# Patient Record
Sex: Female | Born: 1975 | Race: Black or African American | Hispanic: No | Marital: Single | Smoking: Never smoker
Health system: Southern US, Community
[De-identification: ages and names within clinical notes are randomized; demographics above are authoritative.]

## PROBLEM LIST (undated history)

## (undated) DIAGNOSIS — IMO0002 Reserved for concepts with insufficient information to code with codable children: Secondary | ICD-10-CM

## (undated) DIAGNOSIS — D649 Anemia, unspecified: Secondary | ICD-10-CM

## (undated) DIAGNOSIS — I10 Essential (primary) hypertension: Secondary | ICD-10-CM

## (undated) HISTORY — PX: LEEP: SHX91

---

## 1998-02-26 ENCOUNTER — Other Ambulatory Visit: Admission: RE | Admit: 1998-02-26 | Discharge: 1998-02-26 | Payer: Self-pay | Admitting: Gynecology

## 1999-04-12 ENCOUNTER — Emergency Department (HOSPITAL_COMMUNITY): Admission: EM | Admit: 1999-04-12 | Discharge: 1999-04-12 | Payer: Self-pay | Admitting: Emergency Medicine

## 1999-04-12 ENCOUNTER — Encounter: Payer: Self-pay | Admitting: Emergency Medicine

## 2000-02-20 ENCOUNTER — Emergency Department (HOSPITAL_COMMUNITY): Admission: EM | Admit: 2000-02-20 | Discharge: 2000-02-20 | Payer: Self-pay | Admitting: Emergency Medicine

## 2000-02-20 ENCOUNTER — Encounter: Payer: Self-pay | Admitting: Emergency Medicine

## 2000-02-26 ENCOUNTER — Other Ambulatory Visit: Admission: RE | Admit: 2000-02-26 | Discharge: 2000-02-26 | Payer: Self-pay | Admitting: Gynecology

## 2000-02-26 ENCOUNTER — Encounter (INDEPENDENT_AMBULATORY_CARE_PROVIDER_SITE_OTHER): Payer: Self-pay

## 2000-09-28 ENCOUNTER — Emergency Department (HOSPITAL_COMMUNITY): Admission: EM | Admit: 2000-09-28 | Discharge: 2000-09-28 | Payer: Self-pay | Admitting: Emergency Medicine

## 2000-10-14 ENCOUNTER — Encounter: Payer: Self-pay | Admitting: Orthopedic Surgery

## 2000-10-14 ENCOUNTER — Encounter: Admission: RE | Admit: 2000-10-14 | Discharge: 2000-10-14 | Payer: Self-pay | Admitting: Orthopedic Surgery

## 2000-10-26 ENCOUNTER — Other Ambulatory Visit: Admission: RE | Admit: 2000-10-26 | Discharge: 2000-10-26 | Payer: Self-pay | Admitting: Internal Medicine

## 2000-11-16 ENCOUNTER — Inpatient Hospital Stay (HOSPITAL_COMMUNITY): Admission: AD | Admit: 2000-11-16 | Discharge: 2000-11-16 | Payer: Self-pay | Admitting: Obstetrics

## 2000-11-16 ENCOUNTER — Encounter: Payer: Self-pay | Admitting: Obstetrics

## 2000-12-21 ENCOUNTER — Other Ambulatory Visit: Admission: RE | Admit: 2000-12-21 | Discharge: 2000-12-21 | Payer: Self-pay | Admitting: Obstetrics and Gynecology

## 2001-05-01 ENCOUNTER — Encounter: Payer: Self-pay | Admitting: Internal Medicine

## 2001-05-01 ENCOUNTER — Ambulatory Visit (HOSPITAL_COMMUNITY): Admission: RE | Admit: 2001-05-01 | Discharge: 2001-05-01 | Payer: Self-pay | Admitting: Internal Medicine

## 2002-06-15 ENCOUNTER — Inpatient Hospital Stay (HOSPITAL_COMMUNITY): Admission: AD | Admit: 2002-06-15 | Discharge: 2002-06-15 | Payer: Self-pay | Admitting: Obstetrics and Gynecology

## 2002-10-17 ENCOUNTER — Ambulatory Visit (HOSPITAL_COMMUNITY): Admission: RE | Admit: 2002-10-17 | Discharge: 2002-10-17 | Payer: Self-pay | Admitting: Obstetrics

## 2002-10-17 ENCOUNTER — Encounter: Payer: Self-pay | Admitting: Obstetrics

## 2002-10-26 ENCOUNTER — Inpatient Hospital Stay (HOSPITAL_COMMUNITY): Admission: AD | Admit: 2002-10-26 | Discharge: 2002-10-26 | Payer: Self-pay | Admitting: Obstetrics

## 2002-11-06 ENCOUNTER — Ambulatory Visit (HOSPITAL_COMMUNITY): Admission: RE | Admit: 2002-11-06 | Discharge: 2002-11-06 | Payer: Self-pay | Admitting: Obstetrics

## 2002-12-19 ENCOUNTER — Inpatient Hospital Stay (HOSPITAL_COMMUNITY): Admission: AD | Admit: 2002-12-19 | Discharge: 2002-12-19 | Payer: Self-pay | Admitting: Obstetrics

## 2002-12-28 ENCOUNTER — Inpatient Hospital Stay (HOSPITAL_COMMUNITY): Admission: AD | Admit: 2002-12-28 | Discharge: 2002-12-29 | Payer: Self-pay | Admitting: Obstetrics

## 2003-01-07 ENCOUNTER — Inpatient Hospital Stay (HOSPITAL_COMMUNITY): Admission: AD | Admit: 2003-01-07 | Discharge: 2003-01-07 | Payer: Self-pay | Admitting: Obstetrics

## 2003-01-13 ENCOUNTER — Inpatient Hospital Stay (HOSPITAL_COMMUNITY): Admission: AD | Admit: 2003-01-13 | Discharge: 2003-01-13 | Payer: Self-pay | Admitting: Obstetrics

## 2003-01-28 ENCOUNTER — Inpatient Hospital Stay (HOSPITAL_COMMUNITY): Admission: AD | Admit: 2003-01-28 | Discharge: 2003-01-28 | Payer: Self-pay | Admitting: Obstetrics

## 2003-02-01 ENCOUNTER — Encounter (INDEPENDENT_AMBULATORY_CARE_PROVIDER_SITE_OTHER): Payer: Self-pay | Admitting: *Deleted

## 2003-02-01 ENCOUNTER — Inpatient Hospital Stay (HOSPITAL_COMMUNITY): Admission: AD | Admit: 2003-02-01 | Discharge: 2003-02-04 | Payer: Self-pay | Admitting: Obstetrics

## 2003-02-06 ENCOUNTER — Encounter: Admission: RE | Admit: 2003-02-06 | Discharge: 2003-03-08 | Payer: Self-pay | Admitting: Obstetrics

## 2003-03-09 ENCOUNTER — Encounter: Admission: RE | Admit: 2003-03-09 | Discharge: 2003-04-08 | Payer: Self-pay | Admitting: Obstetrics

## 2003-05-07 ENCOUNTER — Encounter: Admission: RE | Admit: 2003-05-07 | Discharge: 2003-06-06 | Payer: Self-pay | Admitting: Obstetrics

## 2003-07-07 ENCOUNTER — Encounter: Admission: RE | Admit: 2003-07-07 | Discharge: 2003-08-06 | Payer: Self-pay | Admitting: Obstetrics

## 2003-09-06 ENCOUNTER — Encounter: Admission: RE | Admit: 2003-09-06 | Discharge: 2003-10-06 | Payer: Self-pay | Admitting: Obstetrics

## 2003-10-07 ENCOUNTER — Encounter: Admission: RE | Admit: 2003-10-07 | Discharge: 2003-11-06 | Payer: Self-pay | Admitting: Obstetrics

## 2003-12-07 ENCOUNTER — Encounter: Admission: RE | Admit: 2003-12-07 | Discharge: 2004-01-06 | Payer: Self-pay | Admitting: Obstetrics

## 2004-02-06 ENCOUNTER — Encounter: Admission: RE | Admit: 2004-02-06 | Discharge: 2004-03-07 | Payer: Self-pay | Admitting: Obstetrics

## 2004-02-06 ENCOUNTER — Ambulatory Visit: Payer: Self-pay | Admitting: Internal Medicine

## 2004-03-08 ENCOUNTER — Encounter: Admission: RE | Admit: 2004-03-08 | Discharge: 2004-04-07 | Payer: Self-pay | Admitting: Obstetrics

## 2004-05-11 ENCOUNTER — Ambulatory Visit: Payer: Self-pay | Admitting: Internal Medicine

## 2004-06-25 ENCOUNTER — Ambulatory Visit: Payer: Self-pay | Admitting: Internal Medicine

## 2004-10-07 ENCOUNTER — Ambulatory Visit: Payer: Self-pay | Admitting: Internal Medicine

## 2004-12-01 ENCOUNTER — Ambulatory Visit: Payer: Self-pay | Admitting: Internal Medicine

## 2004-12-08 ENCOUNTER — Ambulatory Visit: Payer: Self-pay | Admitting: Internal Medicine

## 2005-01-07 ENCOUNTER — Ambulatory Visit: Payer: Self-pay | Admitting: Endocrinology

## 2005-04-15 ENCOUNTER — Ambulatory Visit: Payer: Self-pay | Admitting: Internal Medicine

## 2006-01-19 ENCOUNTER — Ambulatory Visit: Payer: Self-pay | Admitting: Internal Medicine

## 2006-04-13 ENCOUNTER — Ambulatory Visit: Payer: Self-pay | Admitting: Internal Medicine

## 2006-05-30 ENCOUNTER — Ambulatory Visit: Payer: Self-pay | Admitting: Internal Medicine

## 2007-02-20 ENCOUNTER — Telehealth: Payer: Self-pay | Admitting: Internal Medicine

## 2007-06-19 ENCOUNTER — Encounter: Payer: Self-pay | Admitting: *Deleted

## 2007-06-19 DIAGNOSIS — J45909 Unspecified asthma, uncomplicated: Secondary | ICD-10-CM | POA: Insufficient documentation

## 2007-06-19 DIAGNOSIS — D509 Iron deficiency anemia, unspecified: Secondary | ICD-10-CM | POA: Insufficient documentation

## 2007-06-19 DIAGNOSIS — F410 Panic disorder [episodic paroxysmal anxiety] without agoraphobia: Secondary | ICD-10-CM | POA: Insufficient documentation

## 2007-06-19 DIAGNOSIS — G4700 Insomnia, unspecified: Secondary | ICD-10-CM | POA: Insufficient documentation

## 2007-06-19 DIAGNOSIS — Z8659 Personal history of other mental and behavioral disorders: Secondary | ICD-10-CM

## 2007-06-19 DIAGNOSIS — G43909 Migraine, unspecified, not intractable, without status migrainosus: Secondary | ICD-10-CM | POA: Insufficient documentation

## 2007-06-19 DIAGNOSIS — D573 Sickle-cell trait: Secondary | ICD-10-CM

## 2008-09-05 ENCOUNTER — Emergency Department (HOSPITAL_COMMUNITY): Admission: EM | Admit: 2008-09-05 | Discharge: 2008-09-05 | Payer: Self-pay | Admitting: Emergency Medicine

## 2008-09-06 ENCOUNTER — Emergency Department (HOSPITAL_COMMUNITY): Admission: EM | Admit: 2008-09-06 | Discharge: 2008-09-06 | Payer: Self-pay | Admitting: Emergency Medicine

## 2008-09-20 ENCOUNTER — Emergency Department (HOSPITAL_COMMUNITY): Admission: EM | Admit: 2008-09-20 | Discharge: 2008-09-20 | Payer: Self-pay | Admitting: Family Medicine

## 2008-10-28 ENCOUNTER — Encounter: Admission: RE | Admit: 2008-10-28 | Discharge: 2008-10-28 | Payer: Self-pay | Admitting: Family Medicine

## 2009-07-08 ENCOUNTER — Encounter: Admission: RE | Admit: 2009-07-08 | Discharge: 2009-07-08 | Payer: Self-pay | Admitting: Family Medicine

## 2010-01-03 ENCOUNTER — Emergency Department (HOSPITAL_COMMUNITY)
Admission: EM | Admit: 2010-01-03 | Discharge: 2010-01-03 | Payer: Self-pay | Source: Home / Self Care | Admitting: Emergency Medicine

## 2010-01-31 ENCOUNTER — Encounter: Payer: Self-pay | Admitting: Obstetrics

## 2010-02-01 ENCOUNTER — Encounter: Payer: Self-pay | Admitting: Obstetrics & Gynecology

## 2010-02-04 ENCOUNTER — Ambulatory Visit: Payer: Self-pay | Admitting: Genetic Counselor

## 2010-02-11 ENCOUNTER — Encounter: Payer: Self-pay | Admitting: Obstetrics & Gynecology

## 2010-03-23 LAB — POCT URINALYSIS DIPSTICK
Bilirubin Urine: NEGATIVE
Glucose, UA: 250 mg/dL — AB
Hgb urine dipstick: NEGATIVE
Nitrite: POSITIVE — AB
Protein, ur: 30 mg/dL — AB
Specific Gravity, Urine: 1.02 (ref 1.005–1.030)
Urobilinogen, UA: 1 mg/dL (ref 0.0–1.0)
pH: 5 (ref 5.0–8.0)

## 2010-03-23 LAB — POCT PREGNANCY, URINE: Preg Test, Ur: NEGATIVE

## 2010-03-23 LAB — URINE CULTURE
Colony Count: NO GROWTH
Culture  Setup Time: 201112242052
Culture: NO GROWTH

## 2010-05-29 NOTE — H&P (Signed)
Crystal Edwards, Crystal Edwards                     ACCOUNT NO.:  [id number]   MEDICAL RECORD NO.:  [id number]                   PATIENT TYPE:  INP   LOCATION:  9101                                 FACILITY:  [facility]   PHYSICIAN:  Kathreen Cosier, M.D.           DATE OF BIRTH:  1975-09-01   DATE OF ADMISSION:  02/01/2003  DATE OF DISCHARGE:                                HISTORY & PHYSICAL   HISTORY OF PRESENT ILLNESS:  The patient is a 35 year old gravida 3 para 0-0-  2-0, EDC January 30, 2003 who was brought in for induction at term.  Estimated fetal weight 3600 g by ultrasound.  Positive GBS treated with  ampicillin at 37 weeks.  Cervix was 2-3 cm, 70%, vertex, -2.  Membranes were  artificially ruptured and the fluid was clear.  At 3:45 p.m. she was 2-3 cm,  90%, vertex 0 station, and IUPC was inserted and she was on 2 mU of Pitocin.  Shortly after the IUPC the patient started having heavy bleeding and deep  variables with each contraction and the cervix was 3 cm, and it was decided  she would be delivered by C-section because of nonreassuring fetal heart  rate tracing and possibly abruption of placenta.   PHYSICAL EXAMINATION:  GENERAL:  Revealed a well-developed female in labor.  HEENT:  Negative.  LUNGS:  Clear.  HEART:  Regular rhythm, no murmurs, no gallops.  ABDOMEN:  Term.  PELVIC:  As described above.  EXTREMITIES:  Negative.                                               Kathreen Cosier, M.D.    BAM/MEDQ  D:  02/04/2003  T:  02/04/2003  Job:  [id number]

## 2010-05-29 NOTE — Assessment & Plan Note (Signed)
[facility]                             PULMONARY OFFICE NOTE   NAME:Edwards, Crystal HIDROGO                  MRN:          [id number]  DATE:04/13/2006                            DOB:          March 17, 1975    REASON FOR CONSULTATION:  Asthma.   HISTORY:  A 35 year old black female who remembers having difficulty  breathing in hot, humid weather when she ran track in college, but  until 2002 had no chronic respiratory complaints.  Since then she has  been bothered by a cough that is present off and on, day more than  night, occasionally productive of yellow sputum but not typically  exacerbated in the morning.  She has been diagnosed with asthma and  started on Advair which she said helped some.  She uses albuterol on  the average of only 1-2 time weekly and is not consistently aerobically  active but finds that the only time she really needs albuterol is when  she does exercise.  That is, most of her complaints at this point are  related to cough and not dyspnea.  She denies any itching, sneezing, or  subjective wheezing, fevers, chills, sweats, chest pain, or leg  swelling.   PAST MEDICAL HISTORY:  Significant for the absence of atopy or sinus  disease.  Note that she had normal childbirth 3 years ago without a  definite flare up.  No other major illnesses or diagnoses or surgery.   ALLERGIES:  NONE KNOWN.   MEDICATIONS:  1. Advair 250/50 b.i.d.  2. Anorexic agent she is using b.i.d. as well.   SOCIAL HISTORY:  She has never smoked, she works as a Financial controller.  She is the mother of a 60-year-old child of an [employer].   FAMILY HISTORY:  Positive for allergies and asthma in her grandmother.   REVIEW OF SYSTEMS:  Taken in detail on the worksheet, negative except as  outlined above.   PHYSICAL EXAMINATION:  GENERAL:  This is a pleasant ambulatory black  female, in no acute distress.  VITAL SIGNS:  Normal.  HEENT:  Reveals mild  turbinate edema, oropharynx clear.  NECK:  Supple without cervical adenopathy, tenderness, trachea is  midline, no thyromegaly.  LUNGS:  Fields perfectly clear bilaterally to auscultation and  percussion.  Regular rhythm without murmur, gallop, rub.  ABDOMEN:  Soft, benign.  EXTREMITIES:  Warm without calf tenderness, cyanosis, clubbing, edema.   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  That suggests a  possible functional component, although the fact that she is coughing as  her main manifestation with slightly yellow sputum production would  indicate two different diagnoses.  One might be atypical asthma and the  other that is chronic cough related to postnasal drip syndrome.   To sort through the differential I reviewed with her a gastroesophageal  reflux disease diet (since almost all patients with chronic cough are  refluxing whether they know it or not), advising her to avoid mint and  menthol lozenges, to use albuterol 2 puffs every  4 hours p.r.n., but at  this point stop Advair and see if the cough exacerbates or albuterol  needs goes up (in which case definitely asthma is the most likely  diagnosis).   To treat the chronic exercise induced symptoms I have recommended trial  of Singulair 10 mg one q.p.m. to see to what extent this improves her,  and if not, a trial of Qvar (versus a methacholine challenge test for  definitive proof of asthma) would be a reasonable next step.   I have asked her to return in 4 weeks for followup to see if Singulair  is helping, and if her cough persists I am going to recommend a sinus  scan be done immediately before next visit to exclude this from the  list of suspects.   I spent extra time with the patient going over the differential  diagnosis today in detail with her, including the need for a  therapeutic withdrawing of Advair at this time (since Advair  actually  may be contributing in part to her cough).     Charlaine Dalton. Sherene Sires, MD, [facility]  Electronically Signed    MBW/MedQ  DD: 04/13/2006  DT: 04/13/2006  Job #: [id number]

## 2010-05-29 NOTE — Discharge Summary (Signed)
NAME:  Crystal Edwards, Crystal Edwards                     ACCOUNT NO.:  [id number]   MEDICAL RECORD NO.:  [id number]                   PATIENT TYPE:  INP   LOCATION:  9101                                 [facility]ILITY[facility]  [facility]   PHYSICIAN:  Kathreen Cosier, M.D.           DATE OF BIRTH:  08-31-1975   DATE OF ADMISSION:  02/01/2003  DATE OF DISCHARGE:  02/04/2003                                 DISCHARGE SUMMARY   HISTORY OF PRESENT ILLNESS:  The patient is a 35 year old gravida 3, para 0-  0-2-0 who was brought in for induction at term and subsequently had an  abruption and underwent a primary low transverse cesarean section.   LABORATORY DATA:  On admission, her hemoglobin was 10.6, platelets 267,000.  Postoperative hemoglobin 9.5. Platelets 213,000.   HOSPITAL COURSE:  Postoperatively, she did well and was discharged on the  third postoperative day, ambulatory, on a regular diet, on Tylox for pain.   FOLLOW UP:  To see me in 6 weeks.   DISCHARGE DIAGNOSES:  Status post primary low transverse cesarean section at  term for abruption of placenta.                                               Kathreen Cosier, M.D.    BAM/MEDQ  D:  02/04/2003  T:  02/04/2003  Job:  [id number]

## 2010-05-29 NOTE — Op Note (Signed)
Crystal Edwards, Crystal Edwards                     ACCOUNT NO.:  [id number]   MEDICAL RECORD NO.:  [id number]                   PATIENT TYPE:  INP   LOCATION:  9101                                 [facility]   PHYSICIAN:  Kathreen Cosier, M.D.           DATE OF BIRTH:  1976/01/10   DATE OF PROCEDURE:  02/01/2003  DATE OF DISCHARGE:                                 OPERATIVE REPORT   PREOPERATIVE DIAGNOSES:  1. Nonreassuring fetal heart rate tracing.  2. Possibly abruption.   POSTOPERATIVE DIAGNOSIS:  Abruption of the placenta.   SURGEON:  Kathreen Cosier, M.D.   ANESTHESIA:  Epidural.   DESCRIPTION OF PROCEDURE:  Patient on the operating table in a supine  position.  Abdomen prepped and draped, bladder emptied with a Foley  catheter.  A transverse suprapubic incision made and carried down to the  rectus fascia.  The fascia cleaned and incised the length of the incision.  The recti muscles retracted laterally, peritoneum incised longitudinally.  A  transverse incision made in the visceral peritoneum above the bladder and  the bladder mobilized inferiorly.  A transverse lower uterine incision made.  In the uterus was a large amount of blood, which exited the uterus.  She had  a female, Apgar 8 and 9, with terminal meconium.  The pH was 7.22.  The baby  weighed 8 pounds 9 ounces and was a female.  The placenta had a small  abruption.  The placenta was anterior and removed manually.  The uterine  cavity cleaned with dry laps.  The uterine incision closed in one layer with  continuous suture of #1 chromic.  The hemostasis was satisfactory, bladder  flap reattached with 2-0 chromic.  The uterus well-contracted, tubes and  ovaries normal.  Abdomen closed in layers, peritoneum with continuous suture  of 0 chromic, fascia with continuous suture of 0 Dexon.  Skin closed with  subcuticular stitch of 4-0 Monocryl.  Blood loss 500 mL.     Kathreen Cosier, M.D.    BAM/MEDQ  D:  02/04/2003  T:  02/04/2003  Job:  [id number]

## 2010-08-01 ENCOUNTER — Emergency Department (HOSPITAL_COMMUNITY)
Admission: EM | Admit: 2010-08-01 | Discharge: 2010-08-01 | Disposition: A | Discharge status: Home / Self Care | Payer: Self-pay | Attending: Emergency Medicine | Admitting: Emergency Medicine

## 2010-08-01 DIAGNOSIS — J020 Streptococcal pharyngitis: Secondary | ICD-10-CM | POA: Insufficient documentation

## 2010-08-01 DIAGNOSIS — R509 Fever, unspecified: Secondary | ICD-10-CM | POA: Insufficient documentation

## 2010-08-01 DIAGNOSIS — R599 Enlarged lymph nodes, unspecified: Secondary | ICD-10-CM | POA: Insufficient documentation

## 2010-08-01 LAB — RAPID STREP SCREEN (MED CTR MEBANE ONLY): Streptococcus, Group A Screen (Direct): POSITIVE — AB

## 2012-10-10 ENCOUNTER — Emergency Department (HOSPITAL_COMMUNITY)
Admission: EM | Admit: 2012-10-10 | Discharge: 2012-10-11 | Disposition: A | Discharge status: Home / Self Care | Payer: Medicaid Other | Attending: Emergency Medicine | Admitting: Emergency Medicine

## 2012-10-10 ENCOUNTER — Encounter (HOSPITAL_COMMUNITY): Payer: Self-pay | Admitting: Emergency Medicine

## 2012-10-10 DIAGNOSIS — R300 Dysuria: Secondary | ICD-10-CM | POA: Insufficient documentation

## 2012-10-10 DIAGNOSIS — Z3202 Encounter for pregnancy test, result negative: Secondary | ICD-10-CM | POA: Insufficient documentation

## 2012-10-10 DIAGNOSIS — Z8619 Personal history of other infectious and parasitic diseases: Secondary | ICD-10-CM | POA: Insufficient documentation

## 2012-10-10 DIAGNOSIS — D649 Anemia, unspecified: Secondary | ICD-10-CM | POA: Insufficient documentation

## 2012-10-10 DIAGNOSIS — N898 Other specified noninflammatory disorders of vagina: Secondary | ICD-10-CM

## 2012-10-10 DIAGNOSIS — Z79899 Other long term (current) drug therapy: Secondary | ICD-10-CM | POA: Insufficient documentation

## 2012-10-10 HISTORY — DX: Reserved for concepts with insufficient information to code with codable children: IMO0002

## 2012-10-10 HISTORY — DX: Anemia, unspecified: D64.9

## 2012-10-10 LAB — WET PREP, GENITAL
Clue Cells Wet Prep HPF POC: NONE SEEN
Trich, Wet Prep: NONE SEEN
Yeast Wet Prep HPF POC: NONE SEEN

## 2012-10-10 LAB — URINALYSIS, ROUTINE W REFLEX MICROSCOPIC
Bilirubin Urine: NEGATIVE
Glucose, UA: NEGATIVE mg/dL
Ketones, ur: NEGATIVE mg/dL
Leukocytes, UA: NEGATIVE
Nitrite: NEGATIVE
Protein, ur: NEGATIVE mg/dL
Specific Gravity, Urine: 1.031 — ABNORMAL HIGH (ref 1.005–1.030)
Urobilinogen, UA: 0.2 mg/dL (ref 0.0–1.0)
pH: 5 (ref 5.0–8.0)

## 2012-10-10 LAB — URINE MICROSCOPIC-ADD ON

## 2012-10-10 LAB — PREGNANCY, URINE: Preg Test, Ur: NEGATIVE

## 2012-10-10 MED ORDER — CEFIXIME 400 MG PO TABS
400.0000 mg | ORAL_TABLET | Freq: Once | ORAL | Status: AC
  Administered 2012-10-11: 400 mg via ORAL
  Filled 2012-10-10: qty 1

## 2012-10-10 NOTE — ED Notes (Signed)
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

## 2012-10-11 LAB — GC/CHLAMYDIA PROBE AMP
CT Probe RNA: NEGATIVE
GC Probe RNA: NEGATIVE

## 2012-10-11 MED ORDER — DOXYCYCLINE HYCLATE 100 MG PO CAPS: 100.0000 mg | ORAL_CAPSULE | Freq: Two times a day (BID) | ORAL | Status: DC

## 2012-10-11 NOTE — ED Provider Notes (Signed)
  Medical screening examination/treatment/procedure(s) were performed by non-physician practitioner and as supervising physician I was immediately available for consultation/collaboration.    Gerhard Munch, MD 10/11/12 [phone]

## 2012-10-11 NOTE — ED Provider Notes (Signed)
CSN: [id number]     Arrival date & time 10/10/12  1945 History   First MD Initiated Contact with Patient 10/10/12 2157     Chief Complaint  Patient presents with  . Pelvic Pain  . Vaginal Discharge   (Consider location/radiation/quality/duration/timing/severity/associated sxs/prior Treatment) HPI Patient presents emergency department with a one-month history of vaginal pain, and discharged.  Patient, states she's also having discomfort with urination.  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  Patient, states she's not been sexually active for the last 3 years.  Patient denies nausea, vomiting, headache, blurred vision, weakness, numbness, dizziness, fever, chest pain, shortness of breath, or syncope.  Patient, states, that she did not take any medications prior to arrival Past Medical History  Diagnosis Date  . HPV test positive   . Anemia    Past Surgical History  Procedure Laterality Date  . Leep    . Cesarean section     Family History  Problem Relation Age of Onset  . Cancer Mother   . Hypertension Mother   . Cancer Other   . Hypertension Other   . Diabetes Other    History  Substance Use Topics  . Smoking status: Never Smoker   . Smokeless tobacco: Not on file  . Alcohol Use: No   OB History   Grav Para Term Preterm Abortions TAB SAB Ect Mult Living                 Review of Systems All other systems negative except as documented in the HPI. All pertinent positives and negatives as reviewed in the HPI. Allergies  Review of patient's allergies indicates no known allergies.  Home Medications   Current Outpatient Rx  Name  Route  Sig  Dispense  Refill  . ferrous sulfate 325 (65 FE) MG tablet   Oral   Take 325 mg by mouth daily with breakfast.         . HYDROcodone-acetaminophen (VICODIN) 5-500 MG per tablet   Oral   Take 1 tablet by mouth once.         . naproxen sodium (ANAPROX) 220 MG tablet   Oral   Take 220 mg by mouth 2 (two) times daily  with a meal.          BP 161/87  Pulse 76  Temp(Src) 98.3 F (36.8 C) (Oral)  Resp 18  SpO2 100%  LMP 09/24/2012 Physical Exam  Nursing note and vitals reviewed. Constitutional: She is oriented to person, place, and time. She appears well-developed and well-nourished. No distress.  HENT:  Head: Normocephalic and atraumatic.  Eyes: Pupils are equal, round, and reactive to light.  Neck: Normal range of motion. Neck supple.  Cardiovascular: Normal rate, regular rhythm and normal heart sounds.  Exam reveals no gallop and no friction rub.   No murmur heard. Pulmonary/Chest: Effort normal and breath sounds normal. No respiratory distress.  Genitourinary: Pelvic exam was performed with patient supine. Cervix exhibits discharge. Cervix exhibits no motion tenderness and no friability. Right adnexum displays tenderness. Right adnexum displays no mass and no fullness. Left adnexum displays tenderness. Left adnexum displays no mass and no fullness. Vaginal discharge found.  Chaperone present during exam  Neurological: She is alert and oriented to person, place, and time.  Skin: Skin is warm and dry.    ED Course  Procedures (including critical care time) Labs Review Labs Reviewed  WET PREP, GENITAL - Abnormal; Notable for the following:    WBC,  Wet Prep HPF POC FEW (*)    All other components within normal limits  URINALYSIS, ROUTINE W REFLEX MICROSCOPIC - Abnormal; Notable for the following:    Color, Urine AMBER (*)    APPearance CLOUDY (*)    Specific Gravity, Urine 1.031 (*)    Hgb urine dipstick TRACE (*)    All other components within normal limits  URINE MICROSCOPIC-ADD ON - Abnormal; Notable for the following:    Squamous Epithelial / LPF MANY (*)    All other components within normal limits  GC/CHLAMYDIA PROBE AMP  PREGNANCY, URINE   The patient will be treated for vaginal infection based on her HPI and PE. The patient will be referred to [facility]. Told to return here as  needed.  MDM      Carlyle Dolly, PA-C 10/11/12 0013

## 2012-10-16 ENCOUNTER — Ambulatory Visit: Payer: Medicaid Other | Admitting: Obstetrics

## 2012-11-02 ENCOUNTER — Ambulatory Visit: Payer: Self-pay | Admitting: Obstetrics

## 2013-04-26 ENCOUNTER — Other Ambulatory Visit: Payer: Self-pay | Admitting: Obstetrics & Gynecology

## 2013-11-20 ENCOUNTER — Emergency Department (HOSPITAL_COMMUNITY)
Admission: EM | Admit: 2013-11-20 | Discharge: 2013-11-20 | Disposition: A | Discharge status: Home / Self Care | Payer: Medicaid Other | Source: Home / Self Care | Attending: Family Medicine | Admitting: Family Medicine

## 2013-11-20 ENCOUNTER — Encounter (HOSPITAL_COMMUNITY): Payer: Self-pay | Admitting: Emergency Medicine

## 2013-11-20 DIAGNOSIS — I10 Essential (primary) hypertension: Secondary | ICD-10-CM

## 2013-11-20 DIAGNOSIS — R202 Paresthesia of skin: Secondary | ICD-10-CM

## 2013-11-20 LAB — POCT I-STAT, CHEM 8
BUN: 7 mg/dL (ref 6–23)
Calcium, Ion: 1.19 mmol/L (ref 1.12–1.23)
Chloride: 104 mEq/L (ref 96–112)
Creatinine, Ser: 0.7 mg/dL (ref 0.50–1.10)
Glucose, Bld: 109 mg/dL — ABNORMAL HIGH (ref 70–99)
HCT: 31 % — ABNORMAL LOW (ref 36.0–46.0)
Hemoglobin: 10.5 g/dL — ABNORMAL LOW (ref 12.0–15.0)
Potassium: 3.3 mEq/L — ABNORMAL LOW (ref 3.7–5.3)
Sodium: 139 mEq/L (ref 137–147)
TCO2: 24 mmol/L (ref 0–100)

## 2013-11-20 MED ORDER — LISINOPRIL 10 MG PO TABS: 10.0000 mg | ORAL_TABLET | Freq: Every day | ORAL | Status: DC

## 2013-11-20 NOTE — ED Provider Notes (Signed)
Crystal Edwards is a 38 y.o. female who presents to Urgent Care today for Numbness and tingling and hypertension.   Patient notes that her blood pressures been elevated recently. She denies any chest pains or palpitations or shortness of breath. She has never tried blood pressure medicine in the past. She does however note some right arm tingling sensations for the past 2 weeks. She denies any injury or neck pain. No weakness or numbness. No other symptoms.   Past Medical History  Diagnosis Date  . HPV test positive   . Anemia    Past Surgical History  Procedure Laterality Date  . Leep    . Cesarean section     History  Substance Use Topics  . Smoking status: Never Smoker   . Smokeless tobacco: Not on file  . Alcohol Use: No   ROS as above Medications: No current facility-administered medications for this encounter.   Current Outpatient Prescriptions  Medication Sig Dispense Refill  . ferrous sulfate 325 (65 FE) MG tablet Take 325 mg by mouth daily with breakfast.    . lisinopril (PRINIVIL,ZESTRIL) 10 MG tablet Take 1 tablet (10 mg total) by mouth daily. 30 tablet 1   No Known Allergies   Exam:  BP 173/102 mmHg  Pulse 69  Temp(Src) 98.3 F (36.8 C) (Oral)  Resp 16  SpO2 100% Gen: Well NAD HEENT: EOMI,  MMM Lungs: Normal work of breathing. CTABL Heart: RRR no MRG Abd: NABS, Soft. Nondistended, Nontender Exts: Brisk capillary refill, warm and well perfused.  Neuro: normal sensation strength reflexes and coordination bilateral upper extremities Neck: nontender to spinal midline. Normal neck range of motion negative Spurling's test  Results for orders placed or performed during the hospital encounter of 11/20/13 (from the past 24 hour(s))  I-STAT, chem 8     Status: Abnormal   Collection Time: 11/20/13  4:55 PM  Result Value Ref Range   Sodium 139 137 - 147 mEq/L   Potassium 3.3 (L) 3.7 - 5.3 mEq/L   Chloride 104 96 - 112 mEq/L   BUN 7 6 - 23 mg/dL   Creatinine, Ser 0.70 0.50 - 1.10 mg/dL   Glucose, Bld 109 (H) 70 - 99 mg/dL   Calcium, Ion 1.19 1.12 - 1.23 mmol/L   TCO2 24 0 - 100 mmol/L   Hemoglobin 10.5 (L) 12.0 - 15.0 g/dL   HCT 31.0 (L) 36.0 - 46.0 %   No results found.  Assessment and Plan: 38 y.o. female with  1) hypertension: treatment with lisinopril. Advised to use condoms with this medicine.  2) . Paresthesia: unclear etiology. Follow-up with PM&R  Discussed warning signs or symptoms. Please see discharge instructions. Patient expresses understanding.  Elby Showers, MD 11/20/13 [phone]

## 2013-11-20 NOTE — ED Notes (Signed)
Pt is here today because she has had numbness and tingling in her right arm, she has also been hypertensive, pt has taken her BP a few times on her own and it has been elevated, pt is sitting upright and does not appear in any distress

## 2013-11-20 NOTE — Discharge Instructions (Signed)
Thank you for coming in today. Take lisinopril daily for blood pressure control. Be sure to get your labs rechecked in a few weeks. Additionally avoid becoming pregnant while taking this medication. Use birth control if having sex. Follow-up with Dr. Ermalene Postin at [place] for evaluation of your arm.  Call or go to the emergency room if you get worse, have trouble breathing, have chest pains, or palpitations.   Hypertension Hypertension, commonly called high blood pressure, is when the force of blood pumping through your arteries is too strong. Your arteries are the blood vessels that carry blood from your heart throughout your body. A blood pressure reading consists of a higher number over a lower number, such as 110/72. The higher number (systolic) is the pressure inside your arteries when your heart pumps. The lower number (diastolic) is the pressure inside your arteries when your heart relaxes. Ideally you want your blood pressure below 120/80. Hypertension forces your heart to work harder to pump blood. Your arteries may become narrow or stiff. Having hypertension puts you at risk for heart disease, stroke, and other problems.  RISK FACTORS Some risk factors for high blood pressure are controllable. Others are not.  Risk factors you cannot control include:   Race. You may be at higher risk if you are African American.  Age. Risk increases with age.  Gender. Men are at higher risk than women before age 30 years. After age 73, women are at higher risk than men. Risk factors you can control include:  Not getting enough exercise or physical activity.  Being overweight.  Getting too much fat, sugar, calories, or salt in your diet.  Drinking too much alcohol. SIGNS AND SYMPTOMS Hypertension does not usually cause signs or symptoms. Extremely high blood pressure (hypertensive crisis) may cause headache, anxiety, shortness of breath, and nosebleed. DIAGNOSIS  To check if  you have hypertension, your health care provider will measure your blood pressure while you are seated, with your arm held at the level of your heart. It should be measured at least twice using the same arm. Certain conditions can cause a difference in blood pressure between your right and left arms. A blood pressure reading that is higher than normal on one occasion does not mean that you need treatment. If one blood pressure reading is high, ask your health care provider about having it checked again. TREATMENT  Treating high blood pressure includes making lifestyle changes and possibly taking medicine. Living a healthy lifestyle can help lower high blood pressure. You may need to change some of your habits. Lifestyle changes may include:  Following the DASH diet. This diet is high in fruits, vegetables, and whole grains. It is low in salt, red meat, and added sugars.  Getting at least 2 hours of brisk physical activity every week.  Losing weight if necessary.  Not smoking.  Limiting alcoholic beverages.  Learning ways to reduce stress. If lifestyle changes are not enough to get your blood pressure under control, your health care provider may prescribe medicine. You may need to take more than one. Work closely with your health care provider to understand the risks and benefits. HOME CARE INSTRUCTIONS  Have your blood pressure rechecked as directed by your health care provider.   Take medicines only as directed by your health care provider. Follow the directions carefully. Blood pressure medicines must be taken as prescribed. The medicine does not work as well when you skip doses. Skipping doses also puts you at risk  for problems.   Do not smoke.   Monitor your blood pressure at home as directed by your health care provider. SEEK MEDICAL CARE IF:   You think you are having a reaction to medicines taken.  You have recurrent headaches or feel dizzy.  You have swelling in your  ankles.  You have trouble with your vision. SEEK IMMEDIATE MEDICAL CARE IF:  You develop a severe headache or confusion.  You have unusual weakness, numbness, or feel faint.  You have severe chest or abdominal pain.  You vomit repeatedly.  You have trouble breathing. MAKE SURE YOU:   Understand these instructions.  Will watch your condition.  Will get help right away if you are not doing well or get worse. Document Released: 12/28/2004 Document Revised: 05/14/2013 Document Reviewed: 10/20/2012 [facility] Patient Information 2015 [place], [place]. This information is not intended to replace advice given to you by your health care provider. Make sure you discuss any questions you have with your health care provider.

## 2013-11-23 ENCOUNTER — Emergency Department (HOSPITAL_COMMUNITY)
Admission: EM | Admit: 2013-11-23 | Discharge: 2013-11-23 | Disposition: A | Discharge status: Home / Self Care | Payer: Medicaid Other | Attending: Emergency Medicine | Admitting: Emergency Medicine

## 2013-11-23 ENCOUNTER — Emergency Department (HOSPITAL_COMMUNITY): Payer: Medicaid Other

## 2013-11-23 ENCOUNTER — Encounter (HOSPITAL_COMMUNITY): Payer: Self-pay | Admitting: Emergency Medicine

## 2013-11-23 DIAGNOSIS — Z8659 Personal history of other mental and behavioral disorders: Secondary | ICD-10-CM | POA: Insufficient documentation

## 2013-11-23 DIAGNOSIS — Z79899 Other long term (current) drug therapy: Secondary | ICD-10-CM | POA: Diagnosis not present

## 2013-11-23 DIAGNOSIS — R200 Anesthesia of skin: Secondary | ICD-10-CM | POA: Diagnosis not present

## 2013-11-23 DIAGNOSIS — Z8679 Personal history of other diseases of the circulatory system: Secondary | ICD-10-CM | POA: Diagnosis not present

## 2013-11-23 DIAGNOSIS — R202 Paresthesia of skin: Secondary | ICD-10-CM | POA: Insufficient documentation

## 2013-11-23 DIAGNOSIS — J45909 Unspecified asthma, uncomplicated: Secondary | ICD-10-CM | POA: Diagnosis not present

## 2013-11-23 DIAGNOSIS — D509 Iron deficiency anemia, unspecified: Secondary | ICD-10-CM | POA: Diagnosis not present

## 2013-11-23 DIAGNOSIS — R079 Chest pain, unspecified: Secondary | ICD-10-CM | POA: Diagnosis present

## 2013-11-23 LAB — BASIC METABOLIC PANEL
Anion gap: 11 (ref 5–15)
BUN: 9 mg/dL (ref 6–23)
CALCIUM: 9 mg/dL (ref 8.4–10.5)
CO2: 24 meq/L (ref 19–32)
CREATININE: 0.61 mg/dL (ref 0.50–1.10)
Chloride: 104 mEq/L (ref 96–112)
GFR calc Af Amer: >90 mL/min (ref >90)
GFR calc non Af Amer: >90 mL/min (ref >90)
GLUCOSE: 99 mg/dL (ref 70–99)
Potassium: 3.4 mEq/L — ABNORMAL LOW (ref 3.7–5.3)
Sodium: 139 mEq/L (ref 137–147)

## 2013-11-23 LAB — CBC WITH DIFFERENTIAL/PLATELET
BASOS ABS: 0 10*3/uL (ref 0.0–0.1)
Basophils Relative: 0 % (ref 0–1)
EOS PCT: 1 % (ref 0–5)
Eosinophils Absolute: 0 10*3/uL (ref 0.0–0.7)
HEMATOCRIT: 29.4 % — AB (ref 36.0–46.0)
Hemoglobin: 9.1 g/dL — ABNORMAL LOW (ref 12.0–15.0)
LYMPHS ABS: 1.5 10*3/uL (ref 0.7–4.0)
LYMPHS PCT: 37 % (ref 12–46)
MCH: 23.8 pg — ABNORMAL LOW (ref 26.0–34.0)
MCHC: 31 g/dL (ref 30.0–36.0)
MCV: 76.8 fL — AB (ref 78.0–100.0)
Monocytes Absolute: 0.2 10*3/uL (ref 0.1–1.0)
Monocytes Relative: 6 % (ref 3–12)
Neutro Abs: 2.3 10*3/uL (ref 1.7–7.7)
Neutrophils Relative %: 56 % (ref 43–77)
Platelets: 321 10*3/uL (ref 150–400)
RBC: 3.83 MIL/uL — ABNORMAL LOW (ref 3.87–5.11)
RDW: 16.7 % — AB (ref 11.5–15.5)
WBC: 4.2 10*3/uL (ref 4.0–10.5)

## 2013-11-23 LAB — TROPONIN I: Troponin I: <0.3 ng/mL (ref <0.30)

## 2013-11-23 NOTE — ED Notes (Signed)
md at bedside  Pt alert and oriented x4. Respirations even and unlabored, bilateral symmetrical rise and fall of chest. Skin warm and dry. In no acute distress. Denies needs.   

## 2013-11-23 NOTE — ED Provider Notes (Signed)
CSN: [id number]     Arrival date & time 11/23/13  6384 History   First MD Initiated Contact with Patient 11/23/13 0746     Chief Complaint  Patient presents with  . Chest Pain  . arm tingling     right     (Consider location/radiation/quality/duration/timing/severity/associated sxs/prior Treatment) HPI Comments: 38 year old female with history of iron deficiency anemia, panic attack, migraine, asthma presents with 2 complaints. Patient said fairly constant right hand and distal forearm numbness for the past 3 weeks, patient is right-handed in the last 2-3 weeks has been selwing more.patient denies stroke history, weakness, other neuro complaints. Patient hadmild central chest tightness and pressure for the past 2 days constant, no cardiac history, no recent surgery, no blood clot history, no exertional or diaphoretic symptoms, nothing specifically worsens or improves. Patient had recent death in the family and has been more stressed than usual. Patient recently diagnosed with high blood pressure but denies other cardiac risk factors. Nonsmoker  Patient is a 38 y.o. female presenting with chest pain. The history is provided by the patient.  Chest Pain Associated symptoms: numbness   Associated symptoms: no abdominal pain, no back pain, no fever, no headache, no shortness of breath, not vomiting and no weakness     Past Medical History  Diagnosis Date  . HPV test positive   . Anemia    Past Surgical History  Procedure Laterality Date  . Leep    . Cesarean section     Family History  Problem Relation Age of Onset  . Cancer Mother   . Hypertension Mother   . Cancer Other   . Hypertension Other   . Diabetes Other    History  Substance Use Topics  . Smoking status: Never Smoker   . Smokeless tobacco: Not on file  . Alcohol Use: No   OB History    No data available     Review of Systems  Constitutional: Negative for fever and chills.  HENT: Negative for congestion.    Eyes: Negative for visual disturbance.  Respiratory: Negative for shortness of breath.   Cardiovascular: Positive for chest pain.  Gastrointestinal: Negative for vomiting and abdominal pain.  Genitourinary: Negative for dysuria and flank pain.  Musculoskeletal: Negative for back pain, neck pain and neck stiffness.  Skin: Negative for rash.  Neurological: Positive for numbness. Negative for weakness, light-headedness and headaches.      Allergies  Review of patient's allergies indicates no known allergies.  Home Medications   Prior to Admission medications   Medication Sig Start Date End Date Taking? Authorizing Provider  ferrous sulfate 325 (65 FE) MG tablet Take 650 mg by mouth daily with breakfast.    Yes Historical Provider, MD  lisinopril (PRINIVIL,ZESTRIL) 10 MG tablet Take 1 tablet (10 mg total) by mouth daily. 11/20/13  Yes Gregor Hams, MD  naproxen sodium (ANAPROX) 220 MG tablet Take 440 mg by mouth 2 (two) times daily as needed (for pain).   Yes Historical Provider, MD   BP 137/90 mmHg  Pulse 63  Temp(Src) 98.1 F (36.7 C) (Oral)  Resp 18  SpO2 99%  LMP 11/15/2013 Physical Exam  Constitutional: She is oriented to person, place, and time. She appears well-developed and well-nourished.  HENT:  Head: Normocephalic and atraumatic.  Eyes: Conjunctivae are normal. Right eye exhibits no discharge. Left eye exhibits no discharge.  Neck: Normal range of motion. Neck supple. No tracheal deviation present.  Cardiovascular: Normal rate, regular rhythm and intact distal  pulses.   Pulmonary/Chest: Effort normal and breath sounds normal.  Abdominal: Soft. She exhibits no distension. There is no tenderness. There is no guarding.  Musculoskeletal: She exhibits no edema or tenderness.  Neurological: She is alert and oriented to person, place, and time.  5+ strength in UE and LE with f/e at major joints. Sensation to palpation intact in UE and LE. CNs 2-12 grossly intact.  EOMFI.   PERRL.   Finger nose and coordination intact bilateral.   Visual fields intact to finger testing.   Skin: Skin is warm. No rash noted.  Psychiatric: She has a normal mood and affect.  Nursing note and vitals reviewed.   ED Course  Procedures (including critical care time) Labs Review Labs Reviewed  CBC WITH DIFFERENTIAL - Abnormal; Notable for the following:    RBC 3.83 (*)    Hemoglobin 9.1 (*)    HCT 29.4 (*)    MCV 76.8 (*)    MCH 23.8 (*)    RDW 16.7 (*)    All other components within normal limits  BASIC METABOLIC PANEL - Abnormal; Notable for the following:    Potassium 3.4 (*)    All other components within normal limits  TROPONIN I    Imaging Review Dg Chest 2 View  11/23/2013   CLINICAL DATA:  Two day history of chest pain  EXAM: CHEST  2 VIEW  COMPARISON:  None.  FINDINGS: The lungs are clear. The heart size and pulmonary vascularity are normal. No adenopathy. No pneumothorax. No bone lesions.  IMPRESSION: No edema or consolidation.   Electronically Signed   By: Lowella Grip M.D.   On: 11/23/2013 09:08     EKG Interpretation   Date/Time:  Friday November 23 2013 07:48:30 EST Ventricular Rate:  77 PR Interval:  172 QRS Duration: 84 QT Interval:  400 QTC Calculation: 453 R Axis:   63 Text Interpretation:  Sinus rhythm Confirmed by Olanrewaju Osborn  MD, Koleen Celia ([id number])  on 11/23/2013 8:50:48 AM      MDM   Final diagnoses:  Chest pain  Iron deficiency anemia  Numbness and tingling in right hand   Well-appearing patient with 2 likely separate complaints. Numbness for 3 weeks most likely carpal tunnel/peripheral neuropathy. Neuro exam is otherwise normal and today has normal sensation bilateral arms and legs. Patient had atypical constant chest tightness for the past 2 days, patient very low risk cardiac. Plan for troponin, chest x-ray, blood work and likely outpatient follow-up. Discussed trying to wear flexible splint at night to see if that helps with her  numbness in her hand. Chest x-ray reviewed no acute findings. Patient improved and well-appearing on recheck, no significant symptoms currently, no chest pain or shortness of breath. Discussed close follow-up outpatient, screening cardiac eval unremarkable  Results and differential diagnosis were discussed with the patient/parent/guardian. Close follow up outpatient was discussed, comfortable with the plan.   Medications - No data to display  Filed Vitals:   11/23/13 0752 11/23/13 0800 11/23/13 0954 11/23/13 0955  BP: 146/96 150/92 137/90 137/90  Pulse: 73 71 62 63  Temp:  98.1 F (36.7 C) 98.1 F (36.7 C)   TempSrc:  Oral Oral   Resp: 23 20 19 18   SpO2: 97% 100% 99%     Final diagnoses:  Chest pain  Iron deficiency anemia  Numbness and tingling in right hand       Mariea Clonts, MD 11/23/13 1021

## 2013-11-23 NOTE — Discharge Instructions (Signed)
Try wearing a wrist splint for possible carpal tunnel at night. Return for worsening chest or breathing symptoms, follow-up closely with primary Dr. And discuss stress test if symptoms return or worsen.  If you were given medicines take as directed.  If you are on coumadin or contraceptives realize their levels and effectiveness is altered by many different medicines.  If you have any reaction (rash, tongues swelling, other) to the medicines stop taking and see a physician.   Please follow up as directed and return to the ER or see a physician for new or worsening symptoms.  Thank you. Filed Vitals:   11/23/13 0752 11/23/13 0800 11/23/13 0954  BP: 146/96 150/92 137/90  Pulse: 73 71 62  Temp:  98.1 F (36.7 C) 98.1 F (36.7 C)  TempSrc:  Oral Oral  Resp: 23 20 19   SpO2: 97% 100% 99%

## 2013-11-23 NOTE — ED Notes (Signed)
Pt c/o right arm tingling/numbness and central chest pain x 2days. Pt has been monitoring her BP and noticed it's been elevated. Pt went to urgent care about arm tingling  And was told not related to her BP.

## 2013-11-23 NOTE — ED Notes (Signed)
Pt escorted to discharge window. Pt verbalized understanding discharge instructions. In no acute distress.  

## 2014-01-07 ENCOUNTER — Encounter: Payer: Self-pay | Admitting: *Deleted

## 2014-01-08 ENCOUNTER — Encounter: Payer: Self-pay | Admitting: Obstetrics & Gynecology

## 2014-01-17 ENCOUNTER — Telehealth: Payer: Self-pay

## 2014-01-17 NOTE — Telephone Encounter (Signed)
medical records were faxed to [facility] - fax # [phone] on 01/17/14 - records were all in [place]

## 2014-01-20 ENCOUNTER — Emergency Department (HOSPITAL_COMMUNITY)
Admission: EM | Admit: 2014-01-20 | Discharge: 2014-01-20 | Disposition: A | Discharge status: Home / Self Care | Payer: Medicaid Other | Source: Home / Self Care

## 2014-01-20 ENCOUNTER — Encounter (HOSPITAL_COMMUNITY): Payer: Self-pay | Admitting: *Deleted

## 2014-01-20 DIAGNOSIS — Z760 Encounter for issue of repeat prescription: Secondary | ICD-10-CM

## 2014-01-20 HISTORY — DX: Essential (primary) hypertension: I10

## 2014-01-20 MED ORDER — LISINOPRIL 10 MG PO TABS: 10.0000 mg | ORAL_TABLET | Freq: Every day | ORAL | Status: DC

## 2014-01-20 NOTE — ED Notes (Signed)
Ran out of lisinopril - last dose was 1/8.  Denies any c/o's.

## 2014-01-20 NOTE — ED Provider Notes (Signed)
CSN: [id number]     Arrival date & time 01/20/14  0902 History   None    Chief Complaint  Patient presents with  . Medication Refill   (Consider location/radiation/quality/duration/timing/severity/associated sxs/prior Treatment)  HPI   The patient is a 39 y.o. female who presents to Urgent Care today for refill of her blood pressure medication.  The patient denies any recent symptoms which brought her in for her previous visit.  No N/T or HA.  She denies any chest pains or palpitations or shortness of breath. Denies any swelling of hands, feet or legs. No weakness or numbness. No other symptoms.  [name] states she has [employer] which will go into effect on 02/11/2014. Patient states she plans to see Dr. Alain Marion for medication following that date. States unable to make an appointment until her "insurance kicks in".   Past Medical History  Diagnosis Date  . HPV test positive   . Anemia   . Hypertension    Past Surgical History  Procedure Laterality Date  . Leep    . Cesarean section     Family History  Problem Relation Age of Onset  . Cancer Mother   . Hypertension Mother   . Cancer Other   . Hypertension Other   . Diabetes Other    History  Substance Use Topics  . Smoking status: Never Smoker   . Smokeless tobacco: Not on file  . Alcohol Use: No   OB History    No data available     Review of Systems  Constitutional: Negative.   HENT: Negative.   Eyes: Negative.   Respiratory: Negative.  Negative for cough and shortness of breath.   Cardiovascular: Negative.  Negative for chest pain, palpitations and leg swelling.  Gastrointestinal: Negative.   Endocrine: Negative.   Genitourinary: Negative.   Musculoskeletal: Negative.   Skin: Negative.   Allergic/Immunologic: Negative.   Neurological: Negative.  Negative for dizziness, seizures and weakness.  Hematological: Negative.   Psychiatric/Behavioral: Negative.       Allergies  Review of patient's  allergies indicates no known allergies.  Home Medications   Prior to Admission medications   Medication Sig Start Date End Date Taking? Authorizing Provider  ferrous sulfate 325 (65 FE) MG tablet Take 650 mg by mouth daily with breakfast.     Historical Provider, MD  lisinopril (PRINIVIL,ZESTRIL) 10 MG tablet Take 1 tablet (10 mg total) by mouth daily. 01/20/14   Nehemiah Settle, NP  naproxen sodium (ANAPROX) 220 MG tablet Take 440 mg by mouth 2 (two) times daily as needed (for pain).    Historical Provider, MD   BP 157/91 mmHg  Pulse 76  Temp(Src) 98.1 F (36.7 C) (Oral)  Resp 16  SpO2 97%  LMP 01/06/2014 (Exact Date)   Physical Exam  Constitutional: She appears well-developed and well-nourished. She appears distressed.  Cardiovascular: Normal rate, regular rhythm, normal heart sounds and intact distal pulses.  Exam reveals no gallop and no friction rub.   No murmur heard. Negative for carotid bruit or thrill. No pedal edema.  Pulmonary/Chest: Effort normal and breath sounds normal. No respiratory distress. She has no wheezes. She has no rales. She exhibits no tenderness.  Skin: Skin is warm and dry. She is not diaphoretic.  Nursing note and vitals reviewed.    ED Course  Procedures (including critical care time) Labs Review Labs Reviewed - No data to display  Imaging Review No results found.   MDM   1.  Prescription refill    The patient would not be able to provide her any further medication for her hypertension. The patient verbalizes understanding and agrees to plan of care.       Nehemiah Settle, NP 01/20/14 [phone]

## 2014-01-20 NOTE — Discharge Instructions (Signed)
You must follow up with your Primary Care Provider for any further medication.   Medication Refill, Emergency Department We have refilled your medication today as a courtesy to you. It is best for your medical care, however, to take care of getting refills done through your primary caregiver's office. They have your records and can do a better job of follow-up than we can in the emergency department. On maintenance medications, we often only prescribe enough medications to get you by until you are able to see your regular caregiver. This is a more expensive way to refill medications. In the future, please plan for refills so that you will not have to use the emergency department for this. Thank you for your help. Your help allows [place] to better take care of the daily emergencies that enter our department. Document Released: 04/16/2003 Document Revised: 03/22/2011 Document Reviewed: 04/06/2013 [facility] Patient Information 2015 [place], [place]. This information is not intended to replace advice given to you by your health care provider. Make sure you discuss any questions you have with your health care provider.

## 2014-03-06 ENCOUNTER — Ambulatory Visit: Payer: Medicaid Other | Admitting: Certified Nurse Midwife

## 2014-03-15 ENCOUNTER — Ambulatory Visit (INDEPENDENT_AMBULATORY_CARE_PROVIDER_SITE_OTHER): Payer: Medicaid Other | Admitting: Certified Nurse Midwife

## 2014-03-15 ENCOUNTER — Encounter: Payer: Self-pay | Admitting: Certified Nurse Midwife

## 2014-03-15 VITALS — BP 180/98 | HR 80 | Temp 98.0°F | Ht 66.5 in | Wt 190.0 lb

## 2014-03-15 DIAGNOSIS — E669 Obesity, unspecified: Secondary | ICD-10-CM

## 2014-03-15 DIAGNOSIS — I10 Essential (primary) hypertension: Secondary | ICD-10-CM | POA: Insufficient documentation

## 2014-03-15 DIAGNOSIS — Z0000 Encounter for general adult medical examination without abnormal findings: Secondary | ICD-10-CM

## 2014-03-15 DIAGNOSIS — A630 Anogenital (venereal) warts: Secondary | ICD-10-CM | POA: Insufficient documentation

## 2014-03-15 DIAGNOSIS — Z01419 Encounter for gynecological examination (general) (routine) without abnormal findings: Secondary | ICD-10-CM | POA: Diagnosis not present

## 2014-03-15 DIAGNOSIS — Z113 Encounter for screening for infections with a predominantly sexual mode of transmission: Secondary | ICD-10-CM

## 2014-03-15 MED ORDER — SINECATECHINS 15 % EX OINT: 1.0000 "application " | TOPICAL_OINTMENT | Freq: Three times a day (TID) | CUTANEOUS | Status: DC

## 2014-03-15 NOTE — Patient Instructions (Signed)
Genital Warts Genital warts are a sexually transmitted infection. They may appear as small bumps on the tissues of the genital area. CAUSES  Genital warts are caused by a virus called human papillomavirus (HPV). HPV is the most common sexually transmitted disease (STD) and infection of the sex organs. This infection is spread by having unprotected sex with an infected person. It can be spread by vaginal, anal, and oral sex. Many people do not know they are infected. They may be infected for years without problems. However, even if they do not have problems, they can unknowingly pass the infection to their sexual partners. SYMPTOMS   Itching and irritation in the genital area.  Warts that bleed.  Painful sexual intercourse. DIAGNOSIS  Warts are usually recognized with the naked eye on the vagina, vulva, perineum, anus, and rectum. Certain tests can also diagnose genital warts, such as:  A Pap test.  A tissue sample (biopsy) exam.  Colposcopy. A magnifying tool is used to examine the vagina and cervix. The HPV cells will change color when certain solutions are used. TREATMENT  Warts can be removed by:  Applying certain chemicals, such as cantharidin or podophyllin.  Liquid nitrogen freezing (cryotherapy).  Immunotherapy with Candida or Trichophyton injections.  Laser treatment.  Burning with an electrified probe (electrocautery).  Interferon injections.  Surgery. PREVENTION  HPV vaccination can help prevent HPV infections that cause genital warts and that cause cancer of the cervix. It is recommended that the vaccination be given to people between the ages 9 to 26 years old. The vaccine might not work as well or might not work at all if you already have HPV. It should not be given to pregnant women. HOME CARE INSTRUCTIONS   It is important to follow your caregiver's instructions. The warts will not go away without treatment. Repeat treatments are often needed to get rid of warts.  Even after it appears that the warts are gone, the normal tissue underneath often remains infected.  Do not try to treat genital warts with medicine used to treat hand warts. This type of medicine is strong and can burn the skin in the genital area, causing more damage.  Tell your past and current sexual partner(s) that you have genital warts. They may be infected also and need treatment.  Avoid sexual contact while being treated.  Do not touch or scratch the warts. The infection may spread to other parts of your body.  Women with genital warts should have a cervical cancer check (Pap test) at least once a year. This type of cancer is slow-growing and can be cured if found early. Chances of developing cervical cancer are increased with HPV.  Inform your obstetrician about your warts in the event of pregnancy. This virus can be passed to the baby's respiratory tract. Discuss this with your caregiver.  Use a condom during sexual intercourse. Following treatment, the use of condoms will help prevent reinfection.  Ask your caregiver about using over-the-counter anti-itch creams. SEEK MEDICAL CARE IF:   Your treated skin becomes red, swollen, or painful.  You have a fever.  You feel generally ill.  You feel little lumps in and around your genital area.  You are bleeding or have painful sexual intercourse. MAKE SURE YOU:   Understand these instructions.  Will watch your condition.  Will get help right away if you are not doing well or get worse. Document Released: 12/26/1999 Document Revised: 05/14/2013 Document Reviewed: 07/06/2010 [employer] Patient Information 2015 [employer]. This   information is not intended to replace advice given to you by your health care provider. Make sure you discuss any questions you have with your health care provider.  

## 2014-03-15 NOTE — Progress Notes (Signed)
Patient ID: Crystal Edwards, female   DOB: 1975-06-20, 39 y.o.   MRN: [id number]    Subjective:     Crystal Edwards is a 39 y.o. female here for a routine exam.  Current complaints: vaginal/rectal irritation, needs annual exam.  Has recently lost her mother to [facility] [place], also has hx of maternal grandmother & aunt with BCA.      Personal health questionnaire:  Is patient Ashkenazi Jewish, have a family history of breast and/or ovarian cancer: yes Is there a family history of uterine cancer diagnosed at age < 80, gastrointestinal cancer, urinary tract cancer, family member who is a Field seismologist syndrome-associated carrier: no Is the patient overweight and hypertensive, family history of diabetes, personal history of gestational diabetes, preeclampsia or PCOS: yes Is patient over 33, have PCOS,  family history of premature CHD under age 74, diabetes, smoke, have hypertension or peripheral artery disease:  yes At any time, has a partner hit, kicked or otherwise hurt or frightened you?: no Over the past 2 weeks, have you felt down, depressed or hopeless?: yes Over the past 2 weeks, have you felt little interest or pleasure in doing things?:sometimes   Gynecologic History Patient's last menstrual period was 02/22/2014. Contraception: abstinence and condoms Last Pap: 2011. Results were: Leep in 2011 Last mammogram: 03/05/14. Results were: normal  Obstetric History OB History  No data available    Past Medical History  Diagnosis Date  . HPV test positive   . Anemia   . Hypertension     Past Surgical History  Procedure Laterality Date  . Leep    . Cesarean section       Current outpatient prescriptions:  .  ferrous sulfate 325 (65 FE) MG tablet, Take 650 mg by mouth daily with breakfast. , Disp: , Rfl:  .  lisinopril (PRINIVIL,ZESTRIL) 10 MG tablet, Take 1 tablet (10 mg total) by mouth daily., Disp: 30 tablet, Rfl: 0 .  Sinecatechins 15 % OINT, Apply 1 application topically 3  (three) times daily., Disp: 1 Tube, Rfl: 3 No Known Allergies  History  Substance Use Topics  . Smoking status: Never Smoker   . Smokeless tobacco: Not on file  . Alcohol Use: No    Family History  Problem Relation Age of Onset  . Cancer Mother   . Hypertension Mother   . Cancer Other   . Hypertension Other   . Diabetes Other       Review of Systems  Constitutional: negative for fatigue and weight loss Respiratory: negative for cough and wheezing Cardiovascular: negative for chest pain, fatigue and palpitations Gastrointestinal: negative for abdominal pain and change in bowel habits Musculoskeletal:negative for myalgias Neurological: negative for gait problems and tremors Behavioral/Psych: negative for abusive relationship, has some episodes of depression d/t loss of mother, sees counselor.  Endocrine: negative for temperature intolerance   Genitourinary:negative for abnormal menstrual periods, hot flashes, sexual problems and vaginal discharge.  Positive for genital leisions.  Integument/breast: negative for breast lump, breast tenderness, nipple discharge and skin lesion(s)    Objective:       BP 180/98 mmHg  Pulse 80  Temp(Src) 98 F (36.7 C)  Ht 5' 6.5" (1.689 m)  Wt 86.183 kg (190 lb)  BMI 30.21 kg/m2  LMP 02/22/2014 General:   alert  Skin:   no rash or abnormalities  Lungs:   clear to auscultation bilaterally  Heart:   regular rate and rhythm, S1, S2 normal, no murmur, click, rub or gallop  Breasts:   normal without suspicious masses, skin or nipple changes or axillary nodes  Abdomen:  normal findings: no organomegaly, soft, non-tender and no hernia  Pelvis:  External genitalia: normal general appearance, presence of multiple condyloma.  Urinary system: urethral meatus normal and bladder without fullness, nontender Vaginal: normal without tenderness, induration or masses Cervix: normal appearance Adnexa: normal bimanual exam Uterus: anteverted and  non-tender, normal size   Lab Review Urine pregnancy test Labs reviewed yes Radiologic studies reviewed yes  50% of 30 min visit spent on counseling and coordination of care.    Assessment:    Healthy female exam.   Genital Warts Obesity Hypertension   Plan:    Education reviewed: depression evaluation, low fat, low cholesterol diet, safe sex/STD prevention, self breast exams, skin cancer screening and weight bearing exercise. Contraception: condoms.  Considering Paraguard.  Follow up in: 4 months for genital warts. Genetic Screening for BCA d/t family hx.    Encouraged f/u with PCP for the hypertension.    Meds ordered this encounter  Medications  . Sinecatechins 15 % OINT    Sig: Apply 1 application topically 3 (three) times daily.    Dispense:  1 Tube    Refill:  3   Orders Placed This Encounter  Procedures  . SureSwab, Vaginosis/Vaginitis Plus  . HIV antibody (with reflex)  . Hepatitis B surface antigen  . RPR  . Hepatitis C antibody  . Referral to Nutrition and Diabetes Services    Referral Priority:  Routine    Referral Type:  Consultation    Referral Reason:  Specialty Services Required    Number of Visits Requested:  1   Need to obtain previous records

## 2014-03-16 LAB — HEPATITIS C ANTIBODY: HCV Ab: NEGATIVE

## 2014-03-16 LAB — RPR

## 2014-03-16 LAB — HEPATITIS B SURFACE ANTIGEN: HEP B S AG: NEGATIVE

## 2014-03-16 LAB — HIV ANTIBODY (ROUTINE TESTING W REFLEX): HIV: NONREACTIVE

## 2014-03-19 LAB — SURESWAB, VAGINOSIS/VAGINITIS PLUS
ATOPOBIUM VAGINAE: NOT DETECTED Log (cells/mL)
C. PARAPSILOSIS, DNA: NOT DETECTED
C. TRACHOMATIS RNA, TMA: NOT DETECTED
C. albicans, DNA: NOT DETECTED
C. glabrata, DNA: NOT DETECTED
C. tropicalis, DNA: NOT DETECTED
GARDNERELLA VAGINALIS: NOT DETECTED Log (cells/mL)
LACTOBACILLUS SPECIES: 6.3 Log (cells/mL)
MEGASPHAERA SPECIES: NOT DETECTED Log (cells/mL)
N. gonorrhoeae RNA, TMA: NOT DETECTED
T. VAGINALIS RNA, QL TMA: NOT DETECTED

## 2014-03-19 LAB — PAP IG AND HPV HIGH-RISK: HPV DNA HIGH RISK: NOT DETECTED

## 2014-03-25 ENCOUNTER — Other Ambulatory Visit: Payer: Self-pay | Admitting: *Deleted

## 2014-03-25 DIAGNOSIS — A630 Anogenital (venereal) warts: Secondary | ICD-10-CM

## 2014-03-25 MED ORDER — SINECATECHINS 15 % EX OINT: 1.0000 "application " | TOPICAL_OINTMENT | Freq: Three times a day (TID) | CUTANEOUS | Status: DC

## 2014-03-25 MED ORDER — SINECATECHINS 15 % EX OINT: 1.0000 "application " | TOPICAL_OINTMENT | Freq: Three times a day (TID) | CUTANEOUS | Status: AC

## 2014-04-01 ENCOUNTER — Telehealth: Payer: Self-pay | Admitting: *Deleted

## 2014-04-01 NOTE — Telephone Encounter (Signed)
Returning call to pt. Pt made aware of lab results.  Pt request copy of results be mailed to her.  Labs were printed for mailing.

## 2014-04-01 NOTE — Telephone Encounter (Signed)
Pt states in call that she received a letter from [facility] with negative pap results and that she needed to contact office regarding other labs.  Return call to pt.  LM on VM making her aware that all other labs were WNL. Advised pt to contact office if she had any further questions.

## 2014-04-03 ENCOUNTER — Ambulatory Visit: Payer: Medicaid Other | Admitting: Dietician

## 2014-04-22 ENCOUNTER — Ambulatory Visit: Payer: Medicaid Other | Admitting: Skilled Nursing Facility1

## 2014-05-07 ENCOUNTER — Telehealth: Payer: Self-pay | Admitting: *Deleted

## 2014-05-07 ENCOUNTER — Emergency Department (HOSPITAL_COMMUNITY)
Admission: EM | Admit: 2014-05-07 | Discharge: 2014-05-07 | Disposition: A | Discharge status: Home / Self Care | Payer: Medicaid Other | Source: Home / Self Care | Attending: Family Medicine | Admitting: Family Medicine

## 2014-05-07 ENCOUNTER — Other Ambulatory Visit: Payer: Self-pay | Admitting: Obstetrics

## 2014-05-07 ENCOUNTER — Encounter (HOSPITAL_COMMUNITY): Payer: Self-pay | Admitting: Emergency Medicine

## 2014-05-07 DIAGNOSIS — I10 Essential (primary) hypertension: Secondary | ICD-10-CM | POA: Diagnosis not present

## 2014-05-07 DIAGNOSIS — R51 Headache: Secondary | ICD-10-CM | POA: Diagnosis not present

## 2014-05-07 DIAGNOSIS — A499 Bacterial infection, unspecified: Secondary | ICD-10-CM | POA: Diagnosis not present

## 2014-05-07 DIAGNOSIS — N760 Acute vaginitis: Secondary | ICD-10-CM | POA: Diagnosis not present

## 2014-05-07 DIAGNOSIS — B9689 Other specified bacterial agents as the cause of diseases classified elsewhere: Secondary | ICD-10-CM

## 2014-05-07 DIAGNOSIS — R519 Headache, unspecified: Secondary | ICD-10-CM

## 2014-05-07 MED ORDER — METRONIDAZOLE 500 MG PO TABS: 500.0000 mg | ORAL_TABLET | Freq: Two times a day (BID) | ORAL | Status: DC

## 2014-05-07 MED ORDER — LISINOPRIL 10 MG PO TABS: 20.0000 mg | ORAL_TABLET | Freq: Every day | ORAL | Status: DC

## 2014-05-07 NOTE — Telephone Encounter (Signed)
Lisinopril Rx.( 1 month supply ) Patient needs appt with PCP.

## 2014-05-07 NOTE — Discharge Instructions (Signed)
Bacterial Vaginosis Bacterial vaginosis is a vaginal infection that occurs when the normal balance of bacteria in the vagina is disrupted. It results from an overgrowth of certain bacteria. This is the most common vaginal infection in women of childbearing age. Treatment is important to prevent complications, especially in pregnant women, as it can cause a premature delivery. CAUSES  Bacterial vaginosis is caused by an increase in harmful bacteria that are normally present in smaller amounts in the vagina. Several different kinds of bacteria can cause bacterial vaginosis. However, the reason that the condition develops is not fully understood. RISK FACTORS Certain activities or behaviors can put you at an increased risk of developing bacterial vaginosis, including:  Having a new sex partner or multiple sex partners.  Douching.  Using an intrauterine device (IUD) for contraception. Women do not get bacterial vaginosis from toilet seats, bedding, swimming pools, or contact with objects around them. SIGNS AND SYMPTOMS  Some women with bacterial vaginosis have no signs or symptoms. Common symptoms include:  Grey vaginal discharge.  A fishlike odor with discharge, especially after sexual intercourse.  Itching or burning of the vagina and vulva.  Burning or pain with urination. DIAGNOSIS  Your health care provider will take a medical history and examine the vagina for signs of bacterial vaginosis. A sample of vaginal fluid may be taken. Your health care provider will look at this sample under a microscope to check for bacteria and abnormal cells. A vaginal pH test may also be done.  TREATMENT  Bacterial vaginosis may be treated with antibiotic medicines. These may be given in the form of a pill or a vaginal cream. A second round of antibiotics may be prescribed if the condition comes back after treatment.  HOME CARE INSTRUCTIONS   Only take over-the-counter or prescription medicines as  directed by your health care provider.  If antibiotic medicine was prescribed, take it as directed. Make sure you finish it even if you start to feel better.  Do not have sex until treatment is completed.  Tell all sexual partners that you have a vaginal infection. They should see their health care provider and be treated if they have problems, such as a mild rash or itching.  Practice safe sex by using condoms and only having one sex partner. SEEK MEDICAL CARE IF:   Your symptoms are not improving after 3 days of treatment.  You have increased discharge or pain.  You have a fever. MAKE SURE YOU:   Understand these instructions.  Will watch your condition.  Will get help right away if you are not doing well or get worse. FOR MORE INFORMATION  Centers for Disease Control and Prevention, Division of STD Prevention: [employer] American Sexual Health Association (ASHA): www.ashastd.org  Document Released: 12/28/2004 Document Revised: 10/18/2012 Document Reviewed: 08/09/2012 [facility] Patient Information 2015 [place], [place]. This information is not intended to replace advice given to you by your health care provider. Make sure you discuss any questions you have with your health care provider.  Hypertension Hypertension, commonly called high blood pressure, is when the force of blood pumping through your arteries is too strong. Your arteries are the blood vessels that carry blood from your heart throughout your body. A blood pressure reading consists of a higher number over a lower number, such as 110/72. The higher number (systolic) is the pressure inside your arteries when your heart pumps. The lower number (diastolic) is the pressure inside your arteries when your heart relaxes. Ideally you want your blood  pressure below 120/80. Hypertension forces your heart to work harder to pump blood. Your arteries may become narrow or stiff. Having hypertension puts you at risk for heart  disease, stroke, and other problems.  RISK FACTORS Some risk factors for high blood pressure are controllable. Others are not.  Risk factors you cannot control include:   Race. You may be at higher risk if you are African American.  Age. Risk increases with age.  Gender. Men are at higher risk than women before age 52 years. After age 30, women are at higher risk than men. Risk factors you can control include:  Not getting enough exercise or physical activity.  Being overweight.  Getting too much fat, sugar, calories, or salt in your diet.  Drinking too much alcohol. SIGNS AND SYMPTOMS Hypertension does not usually cause signs or symptoms. Extremely high blood pressure (hypertensive crisis) may cause headache, anxiety, shortness of breath, and nosebleed. DIAGNOSIS  To check if you have hypertension, your health care provider will measure your blood pressure while you are seated, with your arm held at the level of your heart. It should be measured at least twice using the same arm. Certain conditions can cause a difference in blood pressure between your right and left arms. A blood pressure reading that is higher than normal on one occasion does not mean that you need treatment. If one blood pressure reading is high, ask your health care provider about having it checked again. TREATMENT  Treating high blood pressure includes making lifestyle changes and possibly taking medicine. Living a healthy lifestyle can help lower high blood pressure. You may need to change some of your habits. Lifestyle changes may include:  Following the DASH diet. This diet is high in fruits, vegetables, and whole grains. It is low in salt, red meat, and added sugars.  Getting at least 2 hours of brisk physical activity every week.  Losing weight if necessary.  Not smoking.  Limiting alcoholic beverages.  Learning ways to reduce stress. If lifestyle changes are not enough to get your blood pressure  under control, your health care provider may prescribe medicine. You may need to take more than one. Work closely with your health care provider to understand the risks and benefits. HOME CARE INSTRUCTIONS  Have your blood pressure rechecked as directed by your health care provider.   Take medicines only as directed by your health care provider. Follow the directions carefully. Blood pressure medicines must be taken as prescribed. The medicine does not work as well when you skip doses. Skipping doses also puts you at risk for problems.   Do not smoke.   Monitor your blood pressure at home as directed by your health care provider. SEEK MEDICAL CARE IF:   You think you are having a reaction to medicines taken.  You have recurrent headaches or feel dizzy.  You have swelling in your ankles.  You have trouble with your vision. SEEK IMMEDIATE MEDICAL CARE IF:  You develop a severe headache or confusion.  You have unusual weakness, numbness, or feel faint.  You have severe chest or abdominal pain.  You vomit repeatedly.  You have trouble breathing. MAKE SURE YOU:   Understand these instructions.  Will watch your condition.  Will get help right away if you are not doing well or get worse. Document Released: 12/28/2004 Document Revised: 05/14/2013 Document Reviewed: 10/20/2012 [facility] Patient Information 2015 [place], [place]. This information is not intended to replace advice given to you by your health  care provider. Make sure you discuss any questions you have with your health care provider.

## 2014-05-07 NOTE — ED Provider Notes (Signed)
CSN: [id number]     Arrival date & time 05/07/14  1001 History   First MD Initiated Contact with Patient 05/07/14 1135     Chief Complaint  Patient presents with  . Hypertension  . Vaginal Discharge   (Consider location/radiation/quality/duration/timing/severity/associated sxs/prior Treatment) HPI Comments: 39 year old female has a history of hypertension. She has been to the urgent care before for refills. Last visit was in January 2016. She saw her PCP one month ago and was started on Zestril 10 mg. She is continue to monitor blood pressure and it is not come down sufficiently. She states it is associated with a headache over the right eye and numbness to the right forearm. When she had headache and numbness before, it was associated with hypertension, and after treatment  headache and numbness gets better.  She is also complaining of a scanty vaginal discharge with a foul fishy odor for 2 days. She has a history of BV. She has been sexually nonactive for 5 years until just recently.   Past Medical History  Diagnosis Date  . HPV test positive   . Anemia   . Hypertension    Past Surgical History  Procedure Laterality Date  . Leep    . Cesarean section     Family History  Problem Relation Age of Onset  . Cancer Mother   . Hypertension Mother   . Cancer Other   . Hypertension Other   . Diabetes Other    History  Substance Use Topics  . Smoking status: Never Smoker   . Smokeless tobacco: Not on file  . Alcohol Use: No   OB History    No data available     Review of Systems  Constitutional: Negative for fever and activity change.  Eyes: Negative.   Respiratory: Negative.   Cardiovascular: Negative.   Gastrointestinal: Negative.   Genitourinary: Positive for vaginal discharge. Negative for dysuria, urgency, frequency, decreased urine volume, vaginal bleeding, vaginal pain and pelvic pain.  Musculoskeletal: Negative.   Skin: Negative for rash.  Neurological: Positive  for headaches. Negative for dizziness, tremors, seizures, syncope, speech difficulty and weakness.  Psychiatric/Behavioral: Negative.     Allergies  Review of patient's allergies indicates no known allergies.  Home Medications   Prior to Admission medications   Medication Sig Start Date End Date Taking? Authorizing Provider  lisinopril (PRINIVIL,ZESTRIL) 10 MG tablet Take 1 tablet (10 mg total) by mouth daily. 01/20/14  Yes Nehemiah Settle, NP  ferrous sulfate 325 (65 FE) MG tablet Take 650 mg by mouth daily with breakfast.     Historical Provider, MD  metroNIDAZOLE (FLAGYL) 500 MG tablet Take 1 tablet (500 mg total) by mouth 2 (two) times daily. X 7 days 05/07/14   Janne Napoleon, NP  Sinecatechins 15 % OINT Apply 1 application topically 3 (three) times daily. 03/25/14 06/17/14  Shelly Bombard, MD   BP 167/91 mmHg  Pulse 69  Temp(Src) 97.5 F (36.4 C) (Oral)  Resp 16  SpO2 100%  LMP 04/23/2014 Physical Exam  Constitutional: She is oriented to person, place, and time. She appears well-developed and well-nourished. No distress.  HENT:  Head: Normocephalic and atraumatic.  Eyes: Conjunctivae and EOM are normal. Pupils are equal, round, and reactive to light.  Neck: Normal range of motion.  Cardiovascular: Normal rate, regular rhythm, normal heart sounds and intact distal pulses.   Pulmonary/Chest: Effort normal and breath sounds normal. No respiratory distress. She has no wheezes. She has no rales.  Abdominal: Soft.  There is no tenderness.  Musculoskeletal: She exhibits no edema or tenderness.  Lymphadenopathy:    She has no cervical adenopathy.  Neurological: She is alert and oriented to person, place, and time. No cranial nerve deficit. She exhibits normal muscle tone.  Skin: Skin is warm and dry.  Psychiatric: She has a normal mood and affect. Her behavior is normal. Thought content normal.  Nursing note and vitals reviewed.   ED Course  Procedures (including critical care  time) Labs Review Labs Reviewed - No data to display  Imaging Review No results found.   MDM   1. Essential hypertension   2. Nonintractable headache, unspecified chronicity pattern, unspecified headache type   3. BV (bacterial vaginosis)     Flagyl 500 twice a day for a week Call your PCP today and let him know about your blood pressure readings. Since she was seen 1 month ago here PCP may be able to increase her medication dosage or add another medicine to it. In the meantime he may take 2 lisinopril 10 mg tablets a day.    Janne Napoleon, NP 05/07/14 [phone]

## 2014-05-07 NOTE — Telephone Encounter (Signed)
Patient is requesting a Rx for her BP. Patient state she needs an increase in her dosage of her BP medication and Urgent Care will not do that for her. She state she has a problem getting in to see her primary and she doesn't want to go to their office and sit and wait all day. Patient is having persistent headache and numbness in her arm. Patient states she has a clinic on her card that does not schedule appointments and she is too sick to wait in line. Urgent Care told her to double her medication and call us until she could be seen. Patient advised she needs to call her worker to change her provider on her card.Will check with Dr Jodi Mourning and see what he wants to do.

## 2014-05-07 NOTE — ED Notes (Signed)
C/o  BP running high for the past week.  States doc office has lost one provider and has been having a hard time getting in.  Headaches with numbness in the right arm.    Denies denies chest pain and sob.     Pt also c/o vaginal discharge with a foul fishy odor symptoms present since Sunday.   Pt has tried refresh gel yesterday with no relief.  Denies pelvic/abdominal pain.

## 2014-05-14 ENCOUNTER — Encounter (HOSPITAL_COMMUNITY): Payer: Self-pay

## 2014-05-14 ENCOUNTER — Emergency Department (HOSPITAL_COMMUNITY)
Admission: EM | Admit: 2014-05-14 | Discharge: 2014-05-14 | Discharge status: Against Medical Advice | Payer: Medicaid Other | Attending: Emergency Medicine | Admitting: Emergency Medicine

## 2014-05-14 DIAGNOSIS — I10 Essential (primary) hypertension: Secondary | ICD-10-CM | POA: Insufficient documentation

## 2014-05-14 DIAGNOSIS — R55 Syncope and collapse: Secondary | ICD-10-CM | POA: Insufficient documentation

## 2014-05-14 DIAGNOSIS — R51 Headache: Secondary | ICD-10-CM | POA: Insufficient documentation

## 2014-05-14 NOTE — ED Notes (Signed)
Patient reports, "I just don't feel right."  Reports she has HTN and has blaming symptoms on that.  Her right arm has been tingling and numb.  She reports a near-syncopal episode while exercising with a trainer yesterday as well.

## 2014-05-14 NOTE — Telephone Encounter (Signed)
Call to patient- LM on VM- just calling to check on her to make sure she was doing ok and following up with primary care.

## 2014-05-15 ENCOUNTER — Encounter (HOSPITAL_COMMUNITY): Payer: Self-pay

## 2014-05-15 ENCOUNTER — Emergency Department (HOSPITAL_COMMUNITY)
Admission: EM | Admit: 2014-05-15 | Discharge: 2014-05-15 | Disposition: A | Discharge status: Home / Self Care | Payer: Medicaid Other | Attending: Emergency Medicine | Admitting: Emergency Medicine

## 2014-05-15 DIAGNOSIS — D649 Anemia, unspecified: Secondary | ICD-10-CM | POA: Insufficient documentation

## 2014-05-15 DIAGNOSIS — J45909 Unspecified asthma, uncomplicated: Secondary | ICD-10-CM | POA: Insufficient documentation

## 2014-05-15 DIAGNOSIS — Z79899 Other long term (current) drug therapy: Secondary | ICD-10-CM | POA: Diagnosis not present

## 2014-05-15 DIAGNOSIS — Z8619 Personal history of other infectious and parasitic diseases: Secondary | ICD-10-CM | POA: Insufficient documentation

## 2014-05-15 DIAGNOSIS — I10 Essential (primary) hypertension: Secondary | ICD-10-CM | POA: Insufficient documentation

## 2014-05-15 DIAGNOSIS — R200 Anesthesia of skin: Secondary | ICD-10-CM | POA: Diagnosis present

## 2014-05-15 LAB — I-STAT CHEM 8, ED
BUN: 9 mg/dL (ref 6–20)
CALCIUM ION: 1.2 mmol/L (ref 1.12–1.23)
CHLORIDE: 104 mmol/L (ref 101–111)
Creatinine, Ser: 0.7 mg/dL (ref 0.44–1.00)
GLUCOSE: 117 mg/dL — AB (ref 70–99)
HCT: 34 % — ABNORMAL LOW (ref 36.0–46.0)
Hemoglobin: 11.6 g/dL — ABNORMAL LOW (ref 12.0–15.0)
Potassium: 3.6 mmol/L (ref 3.5–5.1)
Sodium: 140 mmol/L (ref 135–145)
TCO2: 23 mmol/L (ref 0–100)

## 2014-05-15 MED ORDER — HYDROCHLOROTHIAZIDE 25 MG PO TABS
25.0000 mg | ORAL_TABLET | Freq: Every day | ORAL | Status: DC
Start: 2014-05-15 — End: 2015-04-01

## 2014-05-15 NOTE — Discharge Instructions (Signed)
Watch your diet closely. We are putting you on a fluid pill that can deplete your potassium, so try to eat foods that have extra potassium.    DASH Eating Plan DASH stands for "Dietary Approaches to Stop Hypertension." The DASH eating plan is a healthy eating plan that has been shown to reduce high blood pressure (hypertension). Additional health benefits may include reducing the risk of type 2 diabetes mellitus, heart disease, and stroke. The DASH eating plan may also help with weight loss. WHAT DO I NEED TO KNOW ABOUT THE DASH EATING PLAN? For the DASH eating plan, you will follow these general guidelines:  Choose foods with a percent daily value for sodium of less than 5% (as listed on the food label).  Use salt-free seasonings or herbs instead of table salt or sea salt.  Check with your health care provider or pharmacist before using salt substitutes.  Eat lower-sodium products, often labeled as "lower sodium" or "no salt added."  Eat fresh foods.  Eat more vegetables, fruits, and low-fat dairy products.  Choose whole grains. Look for the word "whole" as the first word in the ingredient list.  Choose fish and skinless chicken or [place] more often than red meat. Limit fish, poultry, and meat to 6 oz (170 g) each day.  Limit sweets, desserts, sugars, and sugary drinks.  Choose heart-healthy fats.  Limit cheese to 1 oz (28 g) per day.  Eat more home-cooked food and less restaurant, buffet, and fast food.  Limit fried foods.  Cook foods using methods other than frying.  Limit canned vegetables. If you do use them, rinse them well to decrease the sodium.  When eating at a restaurant, ask that your food be prepared with less salt, or no salt if possible. WHAT FOODS CAN I EAT? Seek help from a dietitian for individual calorie needs. Grains Whole grain or whole wheat bread. Brown rice. Whole grain or whole wheat pasta. Quinoa, bulgur, and whole grain cereals. Low-sodium  cereals. Corn or whole wheat flour tortillas. Whole grain cornbread. Whole grain crackers. Low-sodium crackers. Vegetables Fresh or frozen vegetables (raw, steamed, roasted, or grilled). Low-sodium or reduced-sodium tomato and vegetable juices. Low-sodium or reduced-sodium tomato sauce and paste. Low-sodium or reduced-sodium canned vegetables.  Fruits All fresh, canned (in natural juice), or frozen fruits. Meat and Other Protein Products Ground beef (85% or leaner), grass-fed beef, or beef trimmed of fat. Skinless chicken or [place]. Ground chicken or [place]. Pork trimmed of fat. All fish and seafood. Eggs. Dried beans, peas, or lentils. Unsalted nuts and seeds. Unsalted canned beans. Dairy Low-fat dairy products, such as skim or 1% milk, 2% or reduced-fat cheeses, low-fat ricotta or cottage cheese, or plain low-fat yogurt. Low-sodium or reduced-sodium cheeses. Fats and Oils Tub margarines without trans fats. Light or reduced-fat mayonnaise and salad dressings (reduced sodium). Avocado. Safflower, olive, or canola oils. Natural peanut or almond butter. Other Unsalted popcorn and pretzels. The items listed above may not be a complete list of recommended foods or beverages. Contact your dietitian for more options. WHAT FOODS ARE NOT RECOMMENDED? Grains White bread. White pasta. White rice. Refined cornbread. Bagels and croissants. Crackers that contain trans fat. Vegetables Creamed or fried vegetables. Vegetables in a cheese sauce. Regular canned vegetables. Regular canned tomato sauce and paste. Regular tomato and vegetable juices. Fruits Dried fruits. Canned fruit in light or heavy syrup. Fruit juice. Meat and Other Protein Products Fatty cuts of meat. Ribs, chicken wings, bacon, sausage, bologna, salami, chitterlings, fatback, hot  dogs, bratwurst, and packaged luncheon meats. Salted nuts and seeds. Canned beans with salt. Dairy Whole or 2% milk, cream, half-and-half, and cream cheese.  Whole-fat or sweetened yogurt. Full-fat cheeses or blue cheese. Nondairy creamers and whipped toppings. Processed cheese, cheese spreads, or cheese curds. Condiments Onion and garlic salt, seasoned salt, table salt, and sea salt. Canned and packaged gravies. Worcestershire sauce. Tartar sauce. Barbecue sauce. Teriyaki sauce. Soy sauce, including reduced sodium. Steak sauce. Fish sauce. Oyster sauce. Cocktail sauce. Horseradish. Ketchup and mustard. Meat flavorings and tenderizers. Bouillon cubes. Hot sauce. Tabasco sauce. Marinades. Taco seasonings. Relishes. Fats and Oils Butter, stick margarine, lard, shortening, ghee, and bacon fat. Coconut, palm kernel, or palm oils. Regular salad dressings. Other Pickles and olives. Salted popcorn and pretzels. The items listed above may not be a complete list of foods and beverages to avoid. Contact your dietitian for more information. WHERE CAN I FIND MORE INFORMATION? National Heart, Lung, and Blood Institute: travelstabloid.com Document Released: 12/17/2010 Document Revised: 05/14/2013 Document Reviewed: 11/01/2012 [facility] Patient Information 2015 [place], [place]. This information is not intended to replace advice given to you by your health care provider. Make sure you discuss any questions you have with your health care provider.  Hypertension Hypertension, commonly called high blood pressure, is when the force of blood pumping through your arteries is too strong. Your arteries are the blood vessels that carry blood from your heart throughout your body. A blood pressure reading consists of a higher number over a lower number, such as 110/72. The higher number (systolic) is the pressure inside your arteries when your heart pumps. The lower number (diastolic) is the pressure inside your arteries when your heart relaxes. Ideally you want your blood pressure below 120/80. Hypertension forces your heart to work harder to pump  blood. Your arteries may become narrow or stiff. Having hypertension puts you at risk for heart disease, stroke, and other problems.  RISK FACTORS Some risk factors for high blood pressure are controllable. Others are not.  Risk factors you cannot control include:   Race. You may be at higher risk if you are African American.  Age. Risk increases with age.  Gender. Men are at higher risk than women before age 31 years. After age 65, women are at higher risk than men. Risk factors you can control include:  Not getting enough exercise or physical activity.  Being overweight.  Getting too much fat, sugar, calories, or salt in your diet.  Drinking too much alcohol. SIGNS AND SYMPTOMS Hypertension does not usually cause signs or symptoms. Extremely high blood pressure (hypertensive crisis) may cause headache, anxiety, shortness of breath, and nosebleed. DIAGNOSIS  To check if you have hypertension, your health care provider will measure your blood pressure while you are seated, with your arm held at the level of your heart. It should be measured at least twice using the same arm. Certain conditions can cause a difference in blood pressure between your right and left arms. A blood pressure reading that is higher than normal on one occasion does not mean that you need treatment. If one blood pressure reading is high, ask your health care provider about having it checked again. TREATMENT  Treating high blood pressure includes making lifestyle changes and possibly taking medicine. Living a healthy lifestyle can help lower high blood pressure. You may need to change some of your habits. Lifestyle changes may include:  Following the DASH diet. This diet is high in fruits, vegetables, and whole grains. It is  low in salt, red meat, and added sugars.  Getting at least 2 hours of brisk physical activity every week.  Losing weight if necessary.  Not smoking.  Limiting alcoholic  beverages.  Learning ways to reduce stress. If lifestyle changes are not enough to get your blood pressure under control, your health care provider may prescribe medicine. You may need to take more than one. Work closely with your health care provider to understand the risks and benefits. HOME CARE INSTRUCTIONS  Have your blood pressure rechecked as directed by your health care provider.   Take medicines only as directed by your health care provider. Follow the directions carefully. Blood pressure medicines must be taken as prescribed. The medicine does not work as well when you skip doses. Skipping doses also puts you at risk for problems.   Do not smoke.   Monitor your blood pressure at home as directed by your health care provider. SEEK MEDICAL CARE IF:   You think you are having a reaction to medicines taken.  You have recurrent headaches or feel dizzy.  You have swelling in your ankles.  You have trouble with your vision. SEEK IMMEDIATE MEDICAL CARE IF:  You develop a severe headache or confusion.  You have unusual weakness, numbness, or feel faint.  You have severe chest or abdominal pain.  You vomit repeatedly.  You have trouble breathing. MAKE SURE YOU:   Understand these instructions.  Will watch your condition.  Will get help right away if you are not doing well or get worse. Document Released: 12/28/2004 Document Revised: 05/14/2013 Document Reviewed: 10/20/2012 [facility] Patient Information 2015 [place], [place]. This information is not intended to replace advice given to you by your health care provider. Make sure you discuss any questions you have with your health care provider.  Potassium Content of Foods Potassium is a mineral found in many foods and drinks. It helps keep fluids and minerals balanced in your body and affects how steadily your heart beats. Potassium also helps control your blood pressure and keep your muscles and nervous system  healthy. Certain health conditions and medicines may change the balance of potassium in your body. When this happens, you can help balance your level of potassium through the foods that you do or do not eat. Your health care provider or dietitian may recommend an amount of potassium that you should have each day. The following lists of foods provide the amount of potassium (in parentheses) per serving in each item. HIGH IN POTASSIUM  The following foods and beverages have 200 mg or more of potassium per serving:  Apricots, 2 raw or 5 dry (200 mg).  Artichoke, 1 medium (345 mg).  Avocado, raw,  each (245 mg).  Banana, 1 medium (425 mg).  Beans, lima, or baked beans, canned,  cup (280 mg).  Beans, white, canned,  cup (595 mg).  Beef roast, 3 oz (320 mg).  Beef, ground, 3 oz (270 mg).  Beets, raw or cooked,  cup (260 mg).  Bran muffin, 2 oz (300 mg).  Broccoli,  cup (230 mg).  Brussels sprouts,  cup (250 mg).  Cantaloupe,  cup (215 mg).  Cereal, 100% bran,  cup (200-400 mg).  Cheeseburger, single, fast food, 1 each (225-400 mg).  Chicken, 3 oz (220 mg).  Clams, canned, 3 oz (535 mg).  Crab, 3 oz (225 mg).  Dates, 5 each (270 mg).  Dried beans and peas,  cup (300-475 mg).  Figs, dried, 2 each (260 mg).  Fish: halibut, tuna, cod, snapper, 3 oz (480 mg).  Fish: salmon, haddock, swordfish, perch, 3 oz (300 mg).  Fish, tuna, canned 3 oz (200 mg).  [place] fries, fast food, 3 oz (470 mg).  Granola with fruit and nuts,  cup (200 mg).  Grapefruit juice,  cup (200 mg).  Greens, beet,  cup (655 mg).  Honeydew melon,  cup (200 mg).  Kale, raw, 1 cup (300 mg).  Kiwi, 1 medium (240 mg).  Kohlrabi, rutabaga, parsnips,  cup (280 mg).  Lentils,  cup (365 mg).  Mango, 1 each (325 mg).  Milk, chocolate, 1 cup (420 mg).  Milk: nonfat, low-fat, whole, buttermilk, 1 cup (350-380 mg).  Molasses, 1 Tbsp (295 mg).  Mushrooms,  cup (280)  mg.  Nectarine, 1 each (275 mg).  Nuts: almonds, peanuts, hazelnuts, [place], cashew, mixed, 1 oz (200 mg).  Nuts, pistachios, 1 oz (295 mg).  Orange, 1 each (240 mg).  Orange juice,  cup (235 mg).  Papaya, medium,  fruit (390 mg).  Peanut butter, chunky, 2 Tbsp (240 mg).  Peanut butter, smooth, 2 Tbsp (210 mg).  Pear, 1 medium (200 mg).  Pomegranate, 1 whole (400 mg).  Pomegranate juice,  cup (215 mg).  Pork, 3 oz (350 mg).  Potato chips, salted, 1 oz (465 mg).  Potato, baked with skin, 1 medium (925 mg).  Potatoes, boiled,  cup (255 mg).  Potatoes, mashed,  cup (330 mg).  Prune juice,  cup (370 mg).  Prunes, 5 each (305 mg).  Pudding, chocolate,  cup (230 mg).  Pumpkin, canned,  cup (250 mg).  Raisins, seedless,  cup (270 mg).  Seeds, sunflower or pumpkin, 1 oz (240 mg).  Soy milk, 1 cup (300 mg).  Spinach,  cup (420 mg).  Spinach, canned,  cup (370 mg).  Sweet potato, baked with skin, 1 medium (450 mg).  Swiss chard,  cup (480 mg).  Tomato or vegetable juice,  cup (275 mg).  Tomato sauce or puree,  cup (400-550 mg).  Tomato, raw, 1 medium (290 mg).  Tomatoes, canned,  cup (200-300 mg).  [place], 3 oz (250 mg).  Wheat germ, 1 oz (250 mg).  Winter squash,  cup (250 mg).  Yogurt, plain or fruited, 6 oz (260-435 mg).  Zucchini,  cup (220 mg). MODERATE IN POTASSIUM The following foods and beverages have 50-200 mg of potassium per serving:  Apple, 1 each (150 mg).  Apple juice,  cup (150 mg).  Applesauce,  cup (90 mg).  Apricot nectar,  cup (140 mg).  Asparagus, small spears,  cup or 6 spears (155 mg).  Bagel, cinnamon raisin, 1 each (130 mg).  Bagel, egg or plain, 4 in., 1 each (70 mg).  Beans, green,  cup (90 mg).  Beans, yellow,  cup (190 mg).  Beer, regular, 12 oz (100 mg).  Beets, canned,  cup (125 mg).  Blackberries,  cup (115 mg).  Blueberries,  cup (60 mg).  Bread, whole wheat, 1 slice  (70 mg).  Broccoli, raw,  cup (145 mg).  Cabbage,  cup (150 mg).  Carrots, cooked or raw,  cup (180 mg).  Cauliflower, raw,  cup (150 mg).  Celery, raw,  cup (155 mg).  Cereal, bran flakes, cup (120-150 mg).  Cheese, cottage,  cup (110 mg).  Cherries, 10 each (150 mg).  Chocolate, 1 oz bar (165 mg).  Coffee, brewed 6 oz (90 mg).  Corn,  cup or 1 ear (195 mg).  Cucumbers,  cup (80 mg).  Egg, large, 1 each (60 mg).  Eggplant,  cup (60 mg).  Endive, raw, cup (80 mg).  English muffin, 1 each (65 mg).  Fish, orange roughy, 3 oz (150 mg).  Frankfurter, beef or pork, 1 each (75 mg).  Fruit cocktail,  cup (115 mg).  Grape juice,  cup (170 mg).  Grapefruit,  fruit (175 mg).  Grapes,  cup (155 mg).  Greens: kale, turnip, collard,  cup (110-150 mg).  Ice cream or frozen yogurt, chocolate,  cup (175 mg).  Ice cream or frozen yogurt, vanilla,  cup (120-150 mg).  Lemons, limes, 1 each (80 mg).  Lettuce, all types, 1 cup (100 mg).  Mixed vegetables,  cup (150 mg).  Mushrooms, raw,  cup (110 mg).  Nuts: walnuts, pecans, or macadamia, 1 oz (125 mg).  Oatmeal,  cup (80 mg).  Okra,  cup (110 mg).  Onions, raw,  cup (120 mg).  Peach, 1 each (185 mg).  Peaches, canned,  cup (120 mg).  Pears, canned,  cup (120 mg).  Peas, green, frozen,  cup (90 mg).  Peppers, green,  cup (130 mg).  Peppers, red,  cup (160 mg).  Pineapple juice,  cup (165 mg).  Pineapple, fresh or canned,  cup (100 mg).  Plums, 1 each (105 mg).  Pudding, vanilla,  cup (150 mg).  Raspberries,  cup (90 mg).  Rhubarb,  cup (115 mg).  Rice, wild,  cup (80 mg).  Shrimp, 3 oz (155 mg).  Spinach, raw, 1 cup (170 mg).  Strawberries,  cup (125 mg).  Summer squash  cup (175-200 mg).  Swiss chard, raw, 1 cup (135 mg).  Tangerines, 1 each (140 mg).  Tea, brewed, 6 oz (65 mg).  Turnips,  cup (140 mg).  Watermelon,  cup (85 mg).  Wine,  red, table, 5 oz (180 mg).  Wine, white, table, 5 oz (100 mg). LOW IN POTASSIUM The following foods and beverages have less than 50 mg of potassium per serving.  Bread, white, 1 slice (30 mg).  Carbonated beverages, 12 oz (less than 5 mg).  Cheese, 1 oz (20-30 mg).  Cranberries,  cup (45 mg).  Cranberry juice cocktail,  cup (20 mg).  Fats and oils, 1 Tbsp (less than 5 mg).  Hummus, 1 Tbsp (32 mg).  Nectar: papaya, mango, or pear,  cup (35 mg).  Rice, white or brown,  cup (50 mg).  Spaghetti or macaroni,  cup cooked (30 mg).  Tortilla, flour or corn, 1 each (50 mg).  Waffle, 4 in., 1 each (50 mg).  Water chestnuts,  cup (40 mg). Document Released: 08/11/2004 Document Revised: 01/02/2013 Document Reviewed: 11/24/2012 [facility] Patient Information 2015 [place], [place]. This information is not intended to replace advice given to you by your health care provider. Make sure you discuss any questions you have with your health care provider.

## 2014-05-15 NOTE — ED Notes (Signed)
Pt c/o hypertension x a couple weeks and R arm numbness and fatigue x 4 days.  Pain score 2/10.  Pt reports taking HTN medication as prescribed.  Pt has been unable to follow up w/ PCP.

## 2014-05-15 NOTE — ED Provider Notes (Signed)
CSN: [id number]     Arrival date & time 05/15/14  1738 History   First MD Initiated Contact with Patient 05/15/14 1847     Chief Complaint  Patient presents with  . Hypertension  . Numbness     (Consider location/radiation/quality/duration/timing/severity/associated sxs/prior Treatment) HPI   Crystal Edwards is a 39 y.o. female who states that her blood pressures been elevated when she checks it at [facility], and when she has been seen by medical providers. She was recently at a urgent care and her lisinopril was doubled to 20 mg each day. She is trying to lose weight, and exercising, and 2 days ago. She had a near syncopal episode while running up some bleacher steps. She did not have chest pain or syncope at the time. She has been exercising for only 10 days. She has occasional periods of headache, and tingling in her right arm. She denies neck pain, back pain, weakness or dizziness. She has heavy periods and has had anemia in the past. She's not taking any other medications. There are no other known modifying factors.   Past Medical History  Diagnosis Date  . HPV test positive   . Anemia   . Hypertension    Past Surgical History  Procedure Laterality Date  . Leep    . Cesarean section     Family History  Problem Relation Age of Onset  . Cancer Mother   . Hypertension Mother   . Cancer Other   . Hypertension Other   . Diabetes Other    History  Substance Use Topics  . Smoking status: Never Smoker   . Smokeless tobacco: Not on file  . Alcohol Use: No   OB History    No data available     Review of Systems  All other systems reviewed and are negative.     Allergies  Review of patient's allergies indicates no known allergies.  Home Medications   Prior to Admission medications   Medication Sig Start Date End Date Taking? Authorizing Provider  ferrous sulfate 325 (65 FE) MG tablet Take 650 mg by mouth daily with breakfast.    Yes Historical Provider, MD   lisinopril (PRINIVIL,ZESTRIL) 10 MG tablet Take 2 tablets (20 mg total) by mouth daily. 05/07/14  Yes Shelly Bombard, MD  naproxen sodium (ANAPROX) 220 MG tablet Take 440 mg by mouth 2 (two) times daily as needed (headahce).   Yes Historical Provider, MD  Sinecatechins 15 % OINT Apply 1 application topically 3 (three) times daily. 03/25/14 06/17/14 Yes Shelly Bombard, MD  metroNIDAZOLE (FLAGYL) 500 MG tablet Take 1 tablet (500 mg total) by mouth 2 (two) times daily. X 7 days Patient not taking: Reported on 05/15/2014 05/07/14   Janne Napoleon, NP   BP 163/98 mmHg  Pulse 71  Temp(Src) 98.9 F (37.2 C)  Resp 16  Ht 5' 7.5" (1.715 m)  Wt 195 lb (88.451 kg)  BMI 30.07 kg/m2  SpO2 99%  LMP 04/23/2014 Physical Exam  Constitutional: She is oriented to person, place, and time. She appears well-developed and well-nourished. No distress.  HENT:  Head: Normocephalic and atraumatic.  Right Ear: External ear normal.  Left Ear: External ear normal.  Eyes: Conjunctivae and EOM are normal. Pupils are equal, round, and reactive to light.  Neck: Normal range of motion and phonation normal. Neck supple.  Cardiovascular: Normal rate, regular rhythm and normal heart sounds.   Pulmonary/Chest: Effort normal and breath sounds normal. She exhibits no bony tenderness.  Abdominal: Soft. There is no tenderness.  Musculoskeletal: Normal range of motion.  Neurological: She is alert and oriented to person, place, and time. No cranial nerve deficit or sensory deficit. She exhibits normal muscle tone. Coordination normal.  No dysarthria and aphasia or nystagmus. No ataxia. No pronator drift.  Skin: Skin is warm, dry and intact.  Psychiatric: She has a normal mood and affect. Her behavior is normal. Judgment and thought content normal.  Nursing note and vitals reviewed.   ED Course  Procedures (including critical care time) Medications - No data to display  Patient Vitals for the past 24 hrs:  BP Temp Pulse Resp  SpO2 Height Weight  05/15/14 2009 163/98 mmHg 98.9 F (37.2 C) 71 16 99 % - -  05/15/14 1900 - - - - - 5' 7.5" (1.715 m) 195 lb (88.451 kg)  05/15/14 1751 147/85 mmHg 97.7 F (36.5 C) 79 18 100 % - -    8:38 PM Reevaluation with update and discussion. After initial assessment and treatment, an updated evaluation reveals no further complaints. Repeat vital signs noted. Findings discussed with patient, all questions answered. Crystal Edwards L \   Labs Review Labs Reviewed  I-STAT CHEM 8, ED - Abnormal; Notable for the following:    Glucose, Bld 117 (*)    Hemoglobin 11.6 (*)    HCT 34.0 (*)    All other components within normal limits    Imaging Review No results found.   EKG Interpretation None      MDM   Final diagnoses:  Essential hypertension    Hypertension, with nonspecific symptoms. She has not improved with single drug therapy. Will add hydrochlorothiazide. Doubt hypertensive urgency, CVA, TIA, or impending vascular collapse.  Nursing Notes Reviewed/ Care Coordinated Applicable Imaging Reviewed Interpretation of Laboratory Data incorporated into ED treatment  The patient appears reasonably screened and/or stabilized for discharge and I doubt any other medical condition or other [facility] requiring further screening, evaluation, or treatment in the ED at this time prior to discharge.  Plan: Home Medications- HCTZ; Home Treatments- rest; return here if the recommended treatment, does not improve the symptoms; Recommended follow up- PCP 1 week for checkup    Daleen Bo, MD 05/15/14 2044

## 2014-05-15 NOTE — ED Notes (Signed)
MD at bedside.EDP WENTZ

## 2014-05-20 ENCOUNTER — Encounter (HOSPITAL_COMMUNITY): Payer: Self-pay | Admitting: Emergency Medicine

## 2014-05-20 ENCOUNTER — Emergency Department (HOSPITAL_COMMUNITY): Payer: Medicaid Other

## 2014-05-20 ENCOUNTER — Emergency Department (HOSPITAL_COMMUNITY)
Admission: EM | Admit: 2014-05-20 | Discharge: 2014-05-20 | Disposition: A | Discharge status: Home / Self Care | Payer: Medicaid Other | Attending: Emergency Medicine | Admitting: Emergency Medicine

## 2014-05-20 DIAGNOSIS — I10 Essential (primary) hypertension: Secondary | ICD-10-CM | POA: Diagnosis not present

## 2014-05-20 DIAGNOSIS — Z3202 Encounter for pregnancy test, result negative: Secondary | ICD-10-CM | POA: Diagnosis not present

## 2014-05-20 DIAGNOSIS — I951 Orthostatic hypotension: Secondary | ICD-10-CM

## 2014-05-20 DIAGNOSIS — D649 Anemia, unspecified: Secondary | ICD-10-CM | POA: Diagnosis not present

## 2014-05-20 DIAGNOSIS — R42 Dizziness and giddiness: Secondary | ICD-10-CM | POA: Diagnosis present

## 2014-05-20 DIAGNOSIS — Z791 Long term (current) use of non-steroidal anti-inflammatories (NSAID): Secondary | ICD-10-CM | POA: Diagnosis not present

## 2014-05-20 DIAGNOSIS — Z79899 Other long term (current) drug therapy: Secondary | ICD-10-CM | POA: Diagnosis not present

## 2014-05-20 LAB — CBC WITH DIFFERENTIAL/PLATELET
Basophils Absolute: 0 10*3/uL (ref 0.0–0.1)
Basophils Relative: 0 % (ref 0–1)
Eosinophils Absolute: 0.1 10*3/uL (ref 0.0–0.7)
Eosinophils Relative: 2 % (ref 0–5)
HEMATOCRIT: 32.7 % — AB (ref 36.0–46.0)
Hemoglobin: 10.2 g/dL — ABNORMAL LOW (ref 12.0–15.0)
LYMPHS ABS: 1.6 10*3/uL (ref 0.7–4.0)
Lymphocytes Relative: 36 % (ref 12–46)
MCH: 24.2 pg — ABNORMAL LOW (ref 26.0–34.0)
MCHC: 31.2 g/dL (ref 30.0–36.0)
MCV: 77.7 fL — ABNORMAL LOW (ref 78.0–100.0)
Monocytes Absolute: 0.3 10*3/uL (ref 0.1–1.0)
Monocytes Relative: 6 % (ref 3–12)
Neutro Abs: 2.5 10*3/uL (ref 1.7–7.7)
Neutrophils Relative %: 56 % (ref 43–77)
PLATELETS: 362 10*3/uL (ref 150–400)
RBC: 4.21 MIL/uL (ref 3.87–5.11)
RDW: 14.9 % (ref 11.5–15.5)
WBC: 4.5 10*3/uL (ref 4.0–10.5)

## 2014-05-20 LAB — URINALYSIS, ROUTINE W REFLEX MICROSCOPIC
Glucose, UA: NEGATIVE mg/dL
Ketones, ur: 15 mg/dL — AB
Nitrite: NEGATIVE
Protein, ur: 100 mg/dL — AB
Specific Gravity, Urine: 1.018 (ref 1.005–1.030)
Urobilinogen, UA: 1 mg/dL (ref 0.0–1.0)
pH: 5 (ref 5.0–8.0)

## 2014-05-20 LAB — COMPREHENSIVE METABOLIC PANEL
ALBUMIN: 3.7 g/dL (ref 3.5–5.0)
ALT: 11 U/L — AB (ref 14–54)
AST: 17 U/L (ref 15–41)
Alkaline Phosphatase: 62 U/L (ref 38–126)
Anion gap: 11 (ref 5–15)
BUN: 15 mg/dL (ref 6–20)
CALCIUM: 8.9 mg/dL (ref 8.9–10.3)
CO2: 25 mmol/L (ref 22–32)
CREATININE: 0.79 mg/dL (ref 0.44–1.00)
Chloride: 100 mmol/L — ABNORMAL LOW (ref 101–111)
GFR calc non Af Amer: >60 mL/min (ref >60)
Glucose, Bld: 111 mg/dL — ABNORMAL HIGH (ref 70–99)
Potassium: 3.1 mmol/L — ABNORMAL LOW (ref 3.5–5.1)
Sodium: 136 mmol/L (ref 135–145)
TOTAL PROTEIN: 7.9 g/dL (ref 6.5–8.1)
Total Bilirubin: 0.7 mg/dL (ref 0.3–1.2)

## 2014-05-20 LAB — URINE MICROSCOPIC-ADD ON

## 2014-05-20 LAB — POC URINE PREG, ED: Preg Test, Ur: NEGATIVE

## 2014-05-20 LAB — TROPONIN I: Troponin I: <0.03 ng/mL (ref <0.031)

## 2014-05-20 NOTE — ED Notes (Signed)
EDP at bedside  

## 2014-05-20 NOTE — ED Provider Notes (Signed)
CSN: [id number]     Arrival date & time 05/20/14  0719 History   First MD Initiated Contact with Patient 05/20/14 [phone]     Chief Complaint  Patient presents with  . Dizziness  . Fatigue     (Consider location/radiation/quality/duration/timing/severity/associated sxs/prior Treatment) HPI The patient reports that for about a week she's been having episodes of getting lightheaded and dizzy. It is particularly made worse by standing and bending over. It has a spinning quality but at other times she just feels weak and like she is getting spots in her vision. She has not had an actual loss of consciousness. There is no associated headache or chest pain. The patient has recently had an increase in therapy for hypertension. She was diagnosed with hypertension a little under a year ago and started on lisinopril. She had fairly consistently had elevated diastolic pressures and last week was started on hydrochlorothiazide. Since that time she has been monitoring her blood pressures at the pharmacy and notes that they have been fairly low for her. Her diastolic pressures have now been more consistently in the 80s whereas as of last week they were fairly consistently 90s to 100s. She states she has been trying to exercise more, drink more water and have a better diet. She has not had fever, chills or other symptoms of general illness. She reports she is currently having her menstrual cycle. She has mild abdominal cramping but nothing worsened typical. She states she is passing more clots than she normally does but doesn't think a total blood volume loss is increased relative to normal. She states she is sexually active and not on birth control but she does not think she is pregnant. Past Medical History  Diagnosis Date  . HPV test positive   . Anemia   . Hypertension    Past Surgical History  Procedure Laterality Date  . Leep    . Cesarean section     Family History  Problem Relation Age of Onset  .  Cancer Mother   . Hypertension Mother   . Cancer Other   . Hypertension Other   . Diabetes Other    History  Substance Use Topics  . Smoking status: Never Smoker   . Smokeless tobacco: Not on file  . Alcohol Use: No   OB History    No data available     Review of Systems 10 Systems reviewed and are negative for acute change except as noted in the HPI.    Allergies  Review of patient's allergies indicates no known allergies.  Home Medications   Prior to Admission medications   Medication Sig Start Date End Date Taking? Authorizing Provider  ferrous sulfate 325 (65 FE) MG tablet Take 650 mg by mouth daily with breakfast.    Yes Historical Provider, MD  lisinopril (PRINIVIL,ZESTRIL) 10 MG tablet Take 2 tablets (20 mg total) by mouth daily. 05/07/14  Yes Shelly Bombard, MD  naproxen sodium (ANAPROX) 220 MG tablet Take 440 mg by mouth 2 (two) times daily as needed (headahce).   Yes Historical Provider, MD  Sinecatechins 15 % OINT Apply 1 application topically 3 (three) times daily. 03/25/14 06/17/14 Yes Shelly Bombard, MD  hydrochlorothiazide (HYDRODIURIL) 25 MG tablet Take 1 tablet (25 mg total) by mouth daily. Patient not taking: Reported on 05/20/2014 05/15/14   Daleen Bo, MD  metroNIDAZOLE (FLAGYL) 500 MG tablet  05/07/14   Historical Provider, MD   BP 129/92 mmHg  Pulse 73  Temp(Src) 97.9  F (36.6 C) (Oral)  Resp 16  Ht 5\' 7"  (1.702 m)  Wt 190 lb (86.183 kg)  BMI 29.75 kg/m2  SpO2 100%  LMP 05/17/2014 (Exact Date) Physical Exam  Constitutional: She is oriented to person, place, and time. She appears well-developed and well-nourished.  HENT:  Head: Normocephalic and atraumatic.  Eyes: EOM are normal. Pupils are equal, round, and reactive to light.  Neck: Neck supple.  Cardiovascular: Normal rate, regular rhythm, normal heart sounds and intact distal pulses.   Pulmonary/Chest: Effort normal and breath sounds normal.  Abdominal: Soft. Bowel sounds are normal. She  exhibits no distension. There is no tenderness.  Musculoskeletal: Normal range of motion. She exhibits no edema.  Neurological: She is alert and oriented to person, place, and time. She has normal strength. Coordination normal. GCS eye subscore is 4. GCS verbal subscore is 5. GCS motor subscore is 6.  Skin: Skin is warm, dry and intact.  Psychiatric: She has a normal mood and affect.    ED Course  Procedures (including critical care time) Labs Review Labs Reviewed  URINALYSIS, ROUTINE W REFLEX MICROSCOPIC - Abnormal; Notable for the following:    Color, Urine RED (*)    APPearance TURBID (*)    Hgb urine dipstick LARGE (*)    Bilirubin Urine MODERATE (*)    Ketones, ur 15 (*)    Protein, ur 100 (*)    Leukocytes, UA MODERATE (*)    All other components within normal limits  COMPREHENSIVE METABOLIC PANEL - Abnormal; Notable for the following:    Potassium 3.1 (*)    Chloride 100 (*)    Glucose, Bld 111 (*)    ALT 11 (*)    All other components within normal limits  CBC WITH DIFFERENTIAL/PLATELET - Abnormal; Notable for the following:    Hemoglobin 10.2 (*)    HCT 32.7 (*)    MCV 77.7 (*)    MCH 24.2 (*)    All other components within normal limits  TROPONIN I  URINE MICROSCOPIC-ADD ON  POC URINE PREG, ED    Imaging Review Dg Chest 2 View  05/20/2014   CLINICAL DATA:  Fatigue and dizziness for past several days  EXAM: CHEST  2 VIEW  COMPARISON:  November 23, 2013  FINDINGS: Lungs are clear. Heart size and pulmonary vascularity are normal. No adenopathy. No bone lesions.  IMPRESSION: No abnormality noted.   Electronically Signed   By: Lowella Grip III M.D.   On: 05/20/2014 08:26     EKG Interpretation   Date/Time:  Monday May 20 2014 08:03:56 EDT Ventricular Rate:  81 PR Interval:  158 QRS Duration: 82 QT Interval:  384 QTC Calculation: 446 R Axis:   73 Text Interpretation:  Sinus rhythm Borderline T wave abnormalities agree  Confirmed by Johnney Killian, MD, Jeannie Done  [phone]) on 05/20/2014 8:21:09 AM      MDM   Final diagnoses:  Orthostatic hypotension   At this time it appears most likely the patient's symptoms are due to orthostatic hypotension with the addition of hydrochlorothiazide and blood pressures being normalize relative to patient's baseline elevated diastolic pressures. She also happens to be menstruating this week as well. As the patient is not frankly hypotensive on her medications, I will have her continue her medications with extra care with standing and position changes. The patient is instructed to follow-up with her family doctor this week for close monitoring of her blood pressures and associated symptoms as she acclimates made to the normalized  pressures and her menstrual cycle ends.     Charlesetta Shanks, MD 05/20/14 1010

## 2014-05-20 NOTE — ED Notes (Signed)
Pt presents to ED C/O dizziness, weakness. Pt states that she almost passed out last Monday. Pt was here within the past week for same reason and was told her BP was elevated. Current BP w/i normal limits. Started HCTZ within past week.

## 2014-05-20 NOTE — Discharge Instructions (Signed)
Near-Syncope  At this time it appears likely your symptoms are due to adjustments to your blood pressure medications. Call your doctor today to schedule blood pressure monitoring over the course of this week. At this time, limit any activities, prolonged standing or rapid position changes that bring on your symptoms.   Near-syncope (commonly known as near fainting) is sudden weakness, dizziness, or feeling like you might pass out. During an episode of near-syncope, you may also develop pale skin, have tunnel vision, or feel sick to your stomach (nauseous). Near-syncope may occur when getting up after sitting or while standing for a long time. It is caused by a sudden decrease in blood flow to the brain. This decrease can result from various causes or triggers, most of which are not serious. However, because near-syncope can sometimes be a sign of something serious, a medical evaluation is required. The specific cause is often not determined. HOME CARE INSTRUCTIONS  Monitor your condition for any changes. The following actions may help to alleviate any discomfort you are experiencing:  Have someone stay with you until you feel stable.  Lie down right away and prop your feet up if you start feeling like you might faint. Breathe deeply and steadily. Wait until all the symptoms have passed. Most of these episodes last only a few minutes. You may feel tired for several hours.   Drink enough fluids to keep your urine clear or pale yellow.   If you are taking blood pressure or heart medicine, get up slowly when seated or lying down. Take several minutes to sit and then stand. This can reduce dizziness.  Follow up with your health care provider as directed. SEEK IMMEDIATE MEDICAL CARE IF:   You have a severe headache.   You have unusual pain in the chest, abdomen, or back.   You are bleeding from the mouth or rectum, or you have black or tarry stool.   You have an irregular or very fast  heartbeat.   You have repeated fainting or have seizure-like jerking during an episode.   You faint when sitting or lying down.   You have confusion.   You have difficulty walking.   You have severe weakness.   You have vision problems.  MAKE SURE YOU:   Understand these instructions.  Will watch your condition.  Will get help right away if you are not doing well or get worse. Document Released: 12/28/2004 Document Revised: 01/02/2013 Document Reviewed: 06/02/2012 [facility] Patient Information 2015 [place], [place]. This information is not intended to replace advice given to you by your health care provider. Make sure you discuss any questions you have with your health care provider.

## 2014-07-09 ENCOUNTER — Ambulatory Visit: Payer: Medicaid Other

## 2015-01-21 ENCOUNTER — Encounter (HOSPITAL_COMMUNITY): Payer: Self-pay | Admitting: *Deleted

## 2015-01-21 ENCOUNTER — Emergency Department (INDEPENDENT_AMBULATORY_CARE_PROVIDER_SITE_OTHER): Payer: Medicaid Other

## 2015-01-21 ENCOUNTER — Emergency Department (HOSPITAL_COMMUNITY)
Admission: EM | Admit: 2015-01-21 | Discharge: 2015-01-21 | Disposition: A | Discharge status: Home / Self Care | Payer: Medicaid Other | Source: Home / Self Care | Attending: Emergency Medicine | Admitting: Emergency Medicine

## 2015-01-21 DIAGNOSIS — J069 Acute upper respiratory infection, unspecified: Secondary | ICD-10-CM

## 2015-01-21 DIAGNOSIS — J9801 Acute bronchospasm: Secondary | ICD-10-CM | POA: Diagnosis not present

## 2015-01-21 MED ORDER — IPRATROPIUM BROMIDE 0.06 % NA SOLN: 2.0000 | Freq: Four times a day (QID) | NASAL | Status: DC

## 2015-01-21 MED ORDER — PREDNISONE 20 MG PO TABS: ORAL_TABLET | ORAL | Status: DC

## 2015-01-21 MED ORDER — ALBUTEROL SULFATE HFA 108 (90 BASE) MCG/ACT IN AERS: 2.0000 | INHALATION_SPRAY | RESPIRATORY_TRACT | Status: DC | PRN

## 2015-01-21 NOTE — ED Notes (Signed)
Pt  Reports  Symptoms  Of  Cough  /  Congestion       Mucous  Production   As  Well  As  Pain in  Her chest  When  She  Coughs     she  Reports  Greenish  sputum production  When  She  Coughs     Symptoms  Are  Not  releived  By otc meds

## 2015-01-21 NOTE — Discharge Instructions (Signed)
Bronchospasm, Adult A bronchospasm is a spasm or tightening of the airways going into the lungs. During a bronchospasm breathing becomes more difficult because the airways get smaller. When this happens there can be coughing, a whistling sound when breathing (wheezing), and difficulty breathing. Bronchospasm is often associated with asthma, but not all patients who experience a bronchospasm have asthma. CAUSES  A bronchospasm is caused by inflammation or irritation of the airways. The inflammation or irritation may be triggered by:   Allergies (such as to animals, pollen, food, or mold). Allergens that cause bronchospasm may cause wheezing immediately after exposure or many hours later.   Infection. Viral infections are believed to be the most common cause of bronchospasm.   Exercise.   Irritants (such as pollution, cigarette smoke, strong odors, aerosol sprays, and paint fumes).   Weather changes. Winds increase molds and pollens in the air. Rain refreshes the air by washing irritants out. Cold air may cause inflammation.   Stress and emotional upset.  SIGNS AND SYMPTOMS   Wheezing.   Excessive nighttime coughing.   Frequent or severe coughing with a simple cold.   Chest tightness.   Shortness of breath.  DIAGNOSIS  Bronchospasm is usually diagnosed through a history and physical exam. Tests, such as chest X-rays, are sometimes done to look for other conditions. TREATMENT   Inhaled medicines can be given to open up your airways and help you breathe. The medicines can be given using either an inhaler or a nebulizer machine.  Corticosteroid medicines may be given for severe bronchospasm, usually when it is associated with asthma. HOME CARE INSTRUCTIONS   Always have a plan prepared for seeking medical care. Know when to call your health care provider and local emergency services (911 in the [place] Know where you can access local emergency care.  Only take medicines as  directed by your health care provider.  If you were prescribed an inhaler or nebulizer machine, ask your health care provider to explain how to use it correctly. Always use a spacer with your inhaler if you were given one.  It is necessary to remain calm during an attack. Try to relax and breathe more slowly.  Control your home environment in the following ways:   Change your heating and air conditioning filter at least once a month.   Limit your use of fireplaces and wood stoves.  Do not smoke and do not allow smoking in your home.   Avoid exposure to perfumes and fragrances.   Get rid of pests (such as roaches and mice) and their droppings.   Throw away plants if you see mold on them.   Keep your house clean and dust free.   Replace carpet with wood, tile, or vinyl flooring. Carpet can trap dander and dust.   Use allergy-proof pillows, mattress covers, and box spring covers.   Wash bed sheets and blankets every week in hot water and dry them in a dryer.   Use blankets that are made of polyester or cotton.   Wash hands frequently. SEEK MEDICAL CARE IF:   You have muscle aches.   You have chest pain.   The sputum changes from clear or white to yellow, green, gray, or bloody.   The sputum you cough up gets thicker.   There are problems that may be related to the medicine you are given, such as a rash, itching, swelling, or trouble breathing.  SEEK IMMEDIATE MEDICAL CARE IF:   You have worsening wheezing and coughing  even after taking your prescribed medicines.   You have increased difficulty breathing.   You develop severe chest pain. MAKE SURE YOU:   Understand these instructions.  Will watch your condition.  Will get help right away if you are not doing well or get worse.   This information is not intended to replace advice given to you by your health care provider. Make sure you discuss any questions you have with your health care  provider.   Document Released: 12/31/2002 Document Revised: 01/18/2014 Document Reviewed: 06/19/2012 Elsevier Interactive Patient Education 2016 [employer].  How to Use an Inhaler Using your inhaler correctly is very important. Good technique will make sure that the medicine reaches your lungs.  HOW TO USE AN INHALER:  Take the cap off the inhaler.  If this is the first time using your inhaler, you need to prime it. Shake the inhaler for 5 seconds. Release four puffs into the air, away from your face. Ask your doctor for help if you have questions.  Shake the inhaler for 5 seconds.  Turn the inhaler so the bottle is above the mouthpiece.  Put your pointer finger on top of the bottle. Your thumb holds the bottom of the inhaler.  Open your mouth.  Either hold the inhaler away from your mouth (the width of 2 fingers) or place your lips tightly around the mouthpiece. Ask your doctor which way to use your inhaler.  Breathe out as much air as possible.  Breathe in and push down on the bottle 1 time to release the medicine. You will feel the medicine go in your mouth and throat.  Continue to take a deep breath in very slowly. Try to fill your lungs.  After you have breathed in completely, hold your breath for 10 seconds. This will help the medicine to settle in your lungs. If you cannot hold your breath for 10 seconds, hold it for as long as you can before you breathe out.  Breathe out slowly, through pursed lips. Whistling is an example of pursed lips.  If your doctor has told you to take more than 1 puff, wait at least 15-30 seconds between puffs. This will help you get the best results from your medicine. Do not use the inhaler more than your doctor tells you to.  Put the cap back on the inhaler.  Follow the directions from your doctor or from the inhaler package about cleaning the inhaler. If you use more than one inhaler, ask your doctor which inhalers to use and what order to  use them in. Ask your doctor to help you figure out when you will need to refill your inhaler.  If you use a steroid inhaler, always rinse your mouth with water after your last puff, gargle and spit out the water. Do not swallow the water. GET HELP IF:  The inhaler medicine only partially helps to stop wheezing or shortness of breath.  You are having trouble using your inhaler.  You have some increase in thick spit (phlegm). GET HELP RIGHT AWAY IF:  The inhaler medicine does not help your wheezing or shortness of breath or you have tightness in your chest.  You have dizziness, headaches, or fast heart rate.  You have chills, fever, or night sweats.  You have a large increase of thick spit, or your thick spit is bloody. MAKE SURE YOU:   Understand these instructions.  Will watch your condition.  Will get help right away if you are not  doing well or get worse.   This information is not intended to replace advice given to you by your health care provider. Make sure you discuss any questions you have with your health care provider.   Document Released: 10/07/2007 Document Revised: 10/18/2012 Document Reviewed: 07/27/2012 Elsevier Interactive Patient Education 2016 [employer]  Upper Respiratory Infection, Adult For drainage may take Allegra or Claritin or Zyrtec. If stronger medicine is needed may take Chlor-Trimeton 2-4 mg every 4-6 hours as needed. This may cause drowsiness. For nasal congestion Sudafed PE 10 mg every 4 hours as needed Use frequent and copious amounts of saline nasal spray. Atrovent nasal spray as directed for runny nose and drainage Drink plenty fluids and stay well-hydrated. Most upper respiratory infections (URIs) are caused by a virus. A URI affects the nose, throat, and upper air passages. The most common type of URI is often called "the common cold." HOME CARE   Take medicines only as told by your doctor.  Gargle warm saltwater or take cough drops to  comfort your throat as told by your doctor.  Use a warm mist humidifier or inhale steam from a shower to increase air moisture. This may make it easier to breathe.  Drink enough fluid to keep your pee (urine) clear or pale yellow.  Eat soups and other clear broths.  Have a healthy diet.  Rest as needed.  Go back to work when your fever is gone or your doctor says it is okay.  You may need to stay home longer to avoid giving your URI to others.  You can also wear a face mask and wash your hands often to prevent spread of the virus.  Use your inhaler more if you have asthma.  Do not use any tobacco products, including cigarettes, chewing tobacco, or electronic cigarettes. If you need help quitting, ask your doctor. GET HELP IF:  You are getting worse, not better.  Your symptoms are not helped by medicine.  You have chills.  You are getting more short of breath.  You have brown or red mucus.  You have yellow or brown discharge from your nose.  You have pain in your face, especially when you bend forward.  You have a fever.  You have puffy (swollen) neck glands.  You have pain while swallowing.  You have white areas in the back of your throat. GET HELP RIGHT AWAY IF:   You have very bad or constant:  Headache.  Ear pain.  Pain in your forehead, behind your eyes, and over your cheekbones (sinus pain).  Chest pain.  You have long-lasting (chronic) lung disease and any of the following:  Wheezing.  Long-lasting cough.  Coughing up blood.  A change in your usual mucus.  You have a stiff neck.  You have changes in your:  Vision.  Hearing.  Thinking.  Mood. MAKE SURE YOU:   Understand these instructions.  Will watch your condition.  Will get help right away if you are not doing well or get worse.   This information is not intended to replace advice given to you by your health care provider. Make sure you discuss any questions you have with  your health care provider.   Document Released: 06/16/2007 Document Revised: 05/14/2014 Document Reviewed: 04/04/2013 Elsevier Interactive Patient Education [employer].

## 2015-01-21 NOTE — ED Provider Notes (Signed)
CSN: [id number]     Arrival date & time 01/21/15  1455 History   First MD Initiated Contact with Patient 01/21/15 1630     Chief Complaint  Patient presents with  . URI   (Consider location/radiation/quality/duration/timing/severity/associated sxs/prior Treatment) HPI Comments: 39 year old female complaining of a one-week history of head and chest congestion, feeling tired and weak, headache, cough, PND and runny nose. Denies documented fevers. She feels cold alternating with feeling hot.   Past Medical History  Diagnosis Date  . HPV test positive   . Anemia   . Hypertension    Past Surgical History  Procedure Laterality Date  . Leep    . Cesarean section     Family History  Problem Relation Age of Onset  . Cancer Mother   . Hypertension Mother   . Cancer Other   . Hypertension Other   . Diabetes Other    Social History  Substance Use Topics  . Smoking status: Never Smoker   . Smokeless tobacco: None  . Alcohol Use: No   OB History    No data available     Review of Systems  Constitutional: Positive for activity change and fatigue. Negative for fever, chills and appetite change.  HENT: Positive for congestion, postnasal drip and rhinorrhea. Negative for facial swelling.   Eyes: Negative.   Respiratory: Positive for cough and shortness of breath.   Cardiovascular: Negative.   Gastrointestinal: Negative.   Genitourinary: Negative.   Musculoskeletal: Negative for neck pain and neck stiffness.  Skin: Negative for pallor and rash.  Neurological: Positive for headaches.  All other systems reviewed and are negative.   Allergies  Review of patient's allergies indicates no known allergies.  Home Medications   Prior to Admission medications   Medication Sig Start Date End Date Taking? Authorizing Provider  albuterol (PROVENTIL HFA;VENTOLIN HFA) 108 (90 Base) MCG/ACT inhaler Inhale 2 puffs into the lungs every 4 (four) hours as needed for wheezing or shortness of  breath. 01/21/15   Janne Napoleon, NP  ferrous sulfate 325 (65 FE) MG tablet Take 650 mg by mouth daily with breakfast.     Historical Provider, MD  hydrochlorothiazide (HYDRODIURIL) 25 MG tablet Take 1 tablet (25 mg total) by mouth daily. Patient not taking: Reported on 05/20/2014 05/15/14   Daleen Bo, MD  ipratropium (ATROVENT) 0.06 % nasal spray Place 2 sprays into both nostrils 4 (four) times daily. 01/21/15   Janne Napoleon, NP  lisinopril (PRINIVIL,ZESTRIL) 10 MG tablet Take 2 tablets (20 mg total) by mouth daily. 05/07/14   Shelly Bombard, MD  metroNIDAZOLE (FLAGYL) 500 MG tablet  05/07/14   Historical Provider, MD  naproxen sodium (ANAPROX) 220 MG tablet Take 440 mg by mouth 2 (two) times daily as needed (headahce).    Historical Provider, MD  predniSONE (DELTASONE) 20 MG tablet Take 3 tabs po on first day, 2 tabs second day, 2 tabs third day, 1 tab fourth day, 1 tab 5th day. Take with food. 01/21/15   Janne Napoleon, NP   Meds Ordered and Administered this Visit  Medications - No data to display  BP 110/71 mmHg  Pulse 80  Temp(Src) 99.1 F (37.3 C) (Oral)  Resp 14  SpO2 100%  LMP 01/15/2015 No data found.   Physical Exam  Constitutional: She appears well-developed and well-nourished. No distress.  HENT:  Mouth/Throat: No oropharyngeal exudate.  Bilateral TMs are normal. Oropharynx with minor erythema, cobblestoning and clear PND. No exudates or swelling.  Eyes: Conjunctivae and  EOM are normal.  Neck: Normal range of motion. Neck supple.  Cardiovascular: Normal rate, regular rhythm, normal heart sounds and intact distal pulses.   Pulmonary/Chest: Effort normal. No respiratory distress. She has no rales.  Expiratory rhonchi in the left upper and mid lung fields. Right lung is clear.  Musculoskeletal: She exhibits no edema.  Lymphadenopathy:    She has no cervical adenopathy.  Neurological: She is alert. No cranial nerve deficit. She exhibits normal muscle tone.  Skin: Skin is warm and  dry. No rash noted.  Nursing note and vitals reviewed.   ED Course  Procedures (including critical care time)  Labs Review Labs Reviewed - No data to display  Imaging Review Dg Chest 2 View  01/21/2015  CLINICAL DATA:  Cough, low grade fever, chest congestion and mild sore throat for the past week, history of asthma and hypertension ; nonsmoker. EXAM: CHEST  2 VIEW COMPARISON:  PA and lateral chest x-ray of May 20, 2014 FINDINGS: The lungs are mildly hyperinflated without hemidiaphragm flattening. There is no interstitial or alveolar infiltrate. The heart and pulmonary vascularity are normal. The mediastinum is normal in width. There is no pleural effusion. The bony thorax exhibits no acute abnormality. IMPRESSION: Mild hyperinflation may be voluntary or may reflect acute bronchitic change. There is no pneumonia, CHF, nor other acute cardiopulmonary disease. Electronically Signed   By: Skarlett Sedlacek  [place] M.D.   On: 01/21/2015 17:15     Visual Acuity Review  Right Eye Distance:   Left Eye Distance:   Bilateral Distance:    Right Eye Near:   Left Eye Near:    Bilateral Near:         MDM   1. URI (upper respiratory infection)   2. Bronchospasm    Upper Respiratory Infection, Adult For drainage may take Allegra or Claritin or Zyrtec. If stronger medicine is needed may take Chlor-Trimeton 2-4 mg every 4-6 hours as needed. This may cause drowsiness. For nasal congestion Sudafed PE 10 mg every 4 hours as needed Use frequent and copious amounts of saline nasal spray. Atrovent nasal spray as directed for runny nose and drainage Drink plenty fluids and stay well-hydrated.    Janne Napoleon, NP 01/21/15 1742

## 2015-03-10 ENCOUNTER — Emergency Department (HOSPITAL_COMMUNITY)
Admission: EM | Admit: 2015-03-10 | Discharge: 2015-03-11 | Disposition: A | Discharge status: Home / Self Care | Payer: Medicaid Other | Attending: Emergency Medicine | Admitting: Emergency Medicine

## 2015-03-10 ENCOUNTER — Emergency Department (HOSPITAL_COMMUNITY): Payer: Medicaid Other

## 2015-03-10 ENCOUNTER — Encounter (HOSPITAL_COMMUNITY): Payer: Self-pay | Admitting: Emergency Medicine

## 2015-03-10 DIAGNOSIS — I10 Essential (primary) hypertension: Secondary | ICD-10-CM | POA: Insufficient documentation

## 2015-03-10 DIAGNOSIS — R079 Chest pain, unspecified: Secondary | ICD-10-CM | POA: Diagnosis present

## 2015-03-10 DIAGNOSIS — D500 Iron deficiency anemia secondary to blood loss (chronic): Secondary | ICD-10-CM

## 2015-03-10 DIAGNOSIS — E876 Hypokalemia: Secondary | ICD-10-CM | POA: Insufficient documentation

## 2015-03-10 DIAGNOSIS — Z8619 Personal history of other infectious and parasitic diseases: Secondary | ICD-10-CM | POA: Diagnosis not present

## 2015-03-10 DIAGNOSIS — Z79899 Other long term (current) drug therapy: Secondary | ICD-10-CM | POA: Insufficient documentation

## 2015-03-10 LAB — CBC
HEMATOCRIT: 30.9 % — AB (ref 36.0–46.0)
HEMOGLOBIN: 9.2 g/dL — AB (ref 12.0–15.0)
MCH: 22.7 pg — AB (ref 26.0–34.0)
MCHC: 29.8 g/dL — AB (ref 30.0–36.0)
MCV: 76.1 fL — AB (ref 78.0–100.0)
Platelets: 334 10*3/uL (ref 150–400)
RBC: 4.06 MIL/uL (ref 3.87–5.11)
RDW: 20.1 % — ABNORMAL HIGH (ref 11.5–15.5)
WBC: 6.1 10*3/uL (ref 4.0–10.5)

## 2015-03-10 LAB — BASIC METABOLIC PANEL
Anion gap: 9 (ref 5–15)
BUN: 10 mg/dL (ref 6–20)
CHLORIDE: 101 mmol/L (ref 101–111)
CO2: 26 mmol/L (ref 22–32)
Calcium: 9 mg/dL (ref 8.9–10.3)
Creatinine, Ser: 0.63 mg/dL (ref 0.44–1.00)
GFR calc Af Amer: >60 mL/min (ref >60)
GFR calc non Af Amer: >60 mL/min (ref >60)
GLUCOSE: 111 mg/dL — AB (ref 65–99)
Potassium: 3 mmol/L — ABNORMAL LOW (ref 3.5–5.1)
SODIUM: 136 mmol/L (ref 135–145)

## 2015-03-10 LAB — I-STAT TROPONIN, ED: Troponin i, poc: 0 ng/mL (ref 0.00–0.08)

## 2015-03-10 LAB — TYPE AND SCREEN
ABO/RH(D): O POS
Antibody Screen: NEGATIVE

## 2015-03-10 LAB — ABO/RH: ABO/RH(D): O POS

## 2015-03-10 NOTE — ED Notes (Signed)
Had blood work done at PCP office showing a Hbg of 7.5. Her PCP stated she also had a new heart murmur. Pt states 2 weeks ago she began having chest pain and SOB, and never decided to be seen for it.

## 2015-03-11 LAB — I-STAT TROPONIN, ED: Troponin i, poc: 0 ng/mL (ref 0.00–0.08)

## 2015-03-11 MED ORDER — POTASSIUM CHLORIDE CRYS ER 20 MEQ PO TBCR: 20.0000 meq | EXTENDED_RELEASE_TABLET | Freq: Every day | ORAL | Status: DC

## 2015-03-11 MED ORDER — FERROUS SULFATE 325 (65 FE) MG PO TABS: 325.0000 mg | ORAL_TABLET | Freq: Three times a day (TID) | ORAL | Status: DC

## 2015-03-11 NOTE — Discharge Instructions (Signed)
Iron Deficiency Anemia, Adult Anemia is a condition in which there are less red blood cells or hemoglobin in the blood than normal. Hemoglobin is the part of red blood cells that carries oxygen. Iron deficiency anemia is anemia caused by too little iron. It is the most common type of anemia. It may leave you tired and short of breath. CAUSES   Lack of iron in the diet.  Poor absorption of iron, as seen with intestinal disorders.  Intestinal bleeding.  Heavy periods. SIGNS AND SYMPTOMS  Mild anemia may not be noticeable. Symptoms may include:  Fatigue.  Headache.  Pale skin.  Weakness.  Tiredness.  Shortness of breath.  Dizziness.  Cold hands and feet.  Fast or irregular heartbeat. DIAGNOSIS  Diagnosis requires a thorough evaluation and physical exam by your health care provider. Blood tests are generally used to confirm iron deficiency anemia. Additional tests may be done to find the underlying cause of your anemia. These may include:  Testing for blood in the stool (fecal occult blood test).  A procedure to see inside the colon and rectum (colonoscopy).  A procedure to see inside the esophagus and stomach (endoscopy). TREATMENT  Iron deficiency anemia is treated by correcting the cause of the deficiency. Treatment may involve:  Adding iron-rich foods to your diet.  Taking iron supplements. Pregnant or breastfeeding women need to take extra iron because their normal diet usually does not provide the required amount.  Taking vitamins. Vitamin C improves the absorption of iron. Your health care provider may recommend that you take your iron tablets with a glass of orange juice or vitamin C supplement.  Medicines to make heavy menstrual flow lighter.  Surgery. HOME CARE INSTRUCTIONS   Take iron as directed by your health care provider.  If you cannot tolerate taking iron supplements by mouth, talk to your health care provider about taking them through a vein  (intravenously) or an injection into a muscle.  For the best iron absorption, iron supplements should be taken on an empty stomach. If you cannot tolerate them on an empty stomach, you may need to take them with food.  Do not drink milk or take antacids at the same time as your iron supplements. Milk and antacids may interfere with the absorption of iron.  Iron supplements can cause constipation. Make sure to include fiber in your diet to prevent constipation. A stool softener may also be recommended.  Take vitamins as directed by your health care provider.  Eat a diet rich in iron. Foods high in iron include liver, lean beef, whole-grain bread, eggs, dried fruit, and dark green leafy vegetables. SEEK IMMEDIATE MEDICAL CARE IF:   You faint. If this happens, do not drive. Call your local emergency services (911 in [place] if no other help is available.  You have chest pain.  You feel nauseous or vomit.  You have severe or increased shortness of breath with activity.  You feel weak.  You have a rapid heartbeat.  You have unexplained sweating.  You become light-headed when getting up from a chair or bed. MAKE SURE YOU:   Understand these instructions.  Will watch your condition.  Will get help right away if you are not doing well or get worse.   This information is not intended to replace advice given to you by your health care provider. Make sure you discuss any questions you have with your health care provider.   Document Released: 12/26/1999 Document Revised: 01/18/2014 Document Reviewed: 09/04/2012 Elsevier  Interactive Patient Education 2016 [place]. Hypokalemia Hypokalemia means that the amount of potassium in the blood is lower than normal.Potassium is a chemical, called an electrolyte, that helps regulate the amount of fluid in the body. It also stimulates muscle contraction and helps nerves function properly.Most of the body's potassium is inside of cells, and only  a very small amount is in the blood. Because the amount in the blood is so small, minor changes can be life-threatening. CAUSES  Antibiotics.  Diarrhea or vomiting.  Using laxatives too much, which can cause diarrhea.  Chronic kidney disease.  Water pills (diuretics).  Eating disorders (bulimia).  Low magnesium level.  Sweating a lot. SIGNS AND SYMPTOMS  Weakness.  Constipation.  Fatigue.  Muscle cramps.  Mental confusion.  Skipped heartbeats or irregular heartbeat (palpitations).  Tingling or numbness. DIAGNOSIS  Your health care provider can diagnose hypokalemia with blood tests. In addition to checking your potassium level, your health care provider may also check other lab tests. TREATMENT Hypokalemia can be treated with potassium supplements taken by mouth or adjustments in your current medicines. If your potassium level is very low, you may need to get potassium through a vein (IV) and be monitored in the hospital. A diet high in potassium is also helpful. Foods high in potassium are:  Nuts, such as peanuts and pistachios.  Seeds, such as sunflower seeds and pumpkin seeds.  Peas, lentils, and lima beans.  Whole grain and bran cereals and breads.  Fresh fruit and vegetables, such as apricots, avocado, bananas, cantaloupe, kiwi, oranges, tomatoes, asparagus, and potatoes.  Orange and tomato juices.  Red meats.  Fruit yogurt. HOME CARE INSTRUCTIONS  Take all medicines as prescribed by your health care provider.  Maintain a healthy diet by including nutritious food, such as fruits, vegetables, nuts, whole grains, and lean meats.  If you are taking a laxative, be sure to follow the directions on the label. SEEK MEDICAL CARE IF:  Your weakness gets worse.  You feel your heart pounding or racing.  You are vomiting or having diarrhea.  You are diabetic and having trouble keeping your blood glucose in the normal range. SEEK IMMEDIATE MEDICAL CARE  IF:  You have chest pain, shortness of breath, or dizziness.  You are vomiting or having diarrhea for more than 2 days.  You faint. MAKE SURE YOU:   Understand these instructions.  Will watch your condition.  Will get help right away if you are not doing well or get worse.   This information is not intended to replace advice given to you by your health care provider. Make sure you discuss any questions you have with your health care provider.   Document Released: 12/28/2004 Document Revised: 01/18/2014 Document Reviewed: 06/30/2012 Elsevier Interactive Patient Education [employer].

## 2015-03-11 NOTE — ED Provider Notes (Signed)
CSN: [id number]     Arrival date & time 03/10/15  1759 History   By signing my name below, I, Crystal Edwards, attest that this documentation has been prepared under the direction and in the presence of Crystal Greek, MD. Electronically Signed: Eustaquio Edwards, ED Scribe. 03/11/2015. 12:32 AM.   Chief Complaint  Patient presents with  . Low HgB   . Chest Pain   The history is provided by the patient. No language interpreter was used.     HPI Comments: Crystal Edwards is a 40 y.o. female with hx anemia who presents to the Emergency Department complaining of gradual onset, intermittent, chest pain x 2 weeks. No known injury or heavy lifting recently. The pain lasts 1-2 hours before subsiding on its own. The pain is exacerbated when pt feels stressed out but there are no other modifying factors. Pt reports that she was seen at PCP's office today to get a refill on one of her prescriptions. When PCP auscultated pt's heart, she told pt that she has a hear murmur. PCP looked into pt's eyes and reported that she was "severely anemic", prompting her to draw blood. Pt's Hgb was 7.5 and was told to come to the ED for further evaluation. Pt reports that she has been anemic for years now. When she has her periods, it usually has blood clots in it. LNMP: 2 weeks ago. Denies nausea, vomiting, shortness of breath, diaphoresis, or any other associated symptoms.   Past Medical History  Diagnosis Date  . HPV test positive   . Anemia   . Hypertension    Past Surgical History  Procedure Laterality Date  . Leep    . Cesarean section     Family History  Problem Relation Age of Onset  . Cancer Mother   . Hypertension Mother   . Cancer Other   . Hypertension Other   . Diabetes Other    Social History  Substance Use Topics  . Smoking status: Never Smoker   . Smokeless tobacco: None  . Alcohol Use: No   OB History    No data available     Review of Systems  Constitutional: Negative for  fever and diaphoresis.  Respiratory: Negative for shortness of breath.   Cardiovascular: Positive for chest pain.  Gastrointestinal: Negative for nausea and vomiting.  All other systems reviewed and are negative.  Allergies  Review of patient's allergies indicates no known allergies.  Home Medications   Prior to Admission medications   Medication Sig Start Date End Date Taking? Authorizing Provider  albuterol (PROVENTIL HFA;VENTOLIN HFA) 108 (90 Base) MCG/ACT inhaler Inhale 2 puffs into the lungs every 4 (four) hours as needed for wheezing or shortness of breath. 01/21/15  Yes Janne Napoleon, NP  lisinopril-hydrochlorothiazide (PRINZIDE,ZESTORETIC) 10-12.5 MG tablet Take 1 tablet by mouth daily. 02/25/15  Yes Historical Provider, MD  naproxen sodium (ANAPROX) 220 MG tablet Take 440 mg by mouth 2 (two) times daily as needed (headahce).   Yes Historical Provider, MD  ferrous sulfate 325 (65 FE) MG tablet Take 1 tablet (325 mg total) by mouth 3 (three) times daily with meals. 03/11/15   Crystal Greek, MD  hydrochlorothiazide (HYDRODIURIL) 25 MG tablet Take 1 tablet (25 mg total) by mouth daily. Patient not taking: Reported on 05/20/2014 05/15/14   Daleen Bo, MD  ipratropium (ATROVENT) 0.06 % nasal spray Place 2 sprays into both nostrils 4 (four) times daily. Patient not taking: Reported on 03/11/2015 01/21/15   Janne Napoleon,  NP  lisinopril (PRINIVIL,ZESTRIL) 10 MG tablet Take 2 tablets (20 mg total) by mouth daily. Patient not taking: Reported on 03/11/2015 05/07/14   Shelly Bombard, MD  potassium chloride SA (K-DUR,KLOR-CON) 20 MEQ tablet Take 1 tablet (20 mEq total) by mouth daily. 03/11/15   Crystal Greek, MD  predniSONE (DELTASONE) 20 MG tablet Take 3 tabs po on first day, 2 tabs second day, 2 tabs third day, 1 tab fourth day, 1 tab 5th day. Take with food. Patient not taking: Reported on 03/11/2015 01/21/15   Janne Napoleon, NP   BP 119/79 mmHg  Pulse 71  Temp(Src) 98.1 F (36.7 C)  (Oral)  Resp 18  SpO2 100%  LMP 02/17/2015   Physical Exam  Constitutional: She is oriented to person, place, and time. She appears well-developed and well-nourished. No distress.  HENT:  Head: Normocephalic and atraumatic.  Right Ear: Hearing normal.  Left Ear: Hearing normal.  Nose: Nose normal.  Mouth/Throat: Oropharynx is clear and moist and mucous membranes are normal.  Eyes: Conjunctivae and EOM are normal. Pupils are equal, round, and reactive to light.  Neck: Normal range of motion. Neck supple.  Cardiovascular: Regular rhythm, S1 normal and S2 normal.  Exam reveals no gallop and no friction rub.   No murmur heard. Pulmonary/Chest: Effort normal and breath sounds normal. No respiratory distress. She exhibits no tenderness.  Abdominal: Soft. Normal appearance and bowel sounds are normal. There is no hepatosplenomegaly. There is no tenderness. There is no rebound, no guarding, no tenderness at McBurney's point and negative Murphy's sign. No hernia.  Musculoskeletal: Normal range of motion.  Neurological: She is alert and oriented to person, place, and time. She has normal strength. No cranial nerve deficit or sensory deficit. Coordination normal. GCS eye subscore is 4. GCS verbal subscore is 5. GCS motor subscore is 6.  Skin: Skin is warm, dry and intact. No rash noted. No cyanosis.  Psychiatric: She has a normal mood and affect. Her speech is normal and behavior is normal. Thought content normal.  Nursing note and vitals reviewed.   ED Course  Procedures (including critical care time)  DIAGNOSTIC STUDIES: Oxygen Saturation is 100% on RA, normal by my interpretation.    COORDINATION OF CARE: 12:24 AM-Discussed treatment plan which includes repeat troponin with pt at bedside and pt agreed to plan.   Labs Review Labs Reviewed  BASIC METABOLIC PANEL - Abnormal; Notable for the following:    Potassium 3.0 (*)    Glucose, Bld 111 (*)    All other components within normal  limits  CBC - Abnormal; Notable for the following:    Hemoglobin 9.2 (*)    HCT 30.9 (*)    MCV 76.1 (*)    MCH 22.7 (*)    MCHC 29.8 (*)    RDW 20.1 (*)    All other components within normal limits  I-STAT TROPOININ, ED  I-STAT TROPOININ, ED  TYPE AND SCREEN  ABO/RH    Imaging Review Dg Chest 2 View  03/10/2015  CLINICAL DATA:  Anemia and heart murmur discovered today. Two week history of chest pain and shortness of breath. EXAM: CHEST  2 VIEW COMPARISON:  01/21/2015 FINDINGS: The cardiac silhouette, mediastinal and hilar contours are within normal limits and stable. The lungs are clear. No pleural effusion. The bony thorax is intact. IMPRESSION: Normal chest x-ray. Electronically Signed   By: Marijo Sanes M.D.   On: 03/10/2015 19:05   I have personally reviewed and evaluated these  images and lab results as part of my medical decision-making.   EKG Interpretation   Date/Time:  Monday March 10 2015 18:41:24 EST Ventricular Rate:  71 PR Interval:  157 QRS Duration: 80 QT Interval:  411 QTC Calculation: 447 R Axis:   76 Text Interpretation:  Sinus rhythm Abnormal R-wave progression, early  transition no sig change c/w old Confirmed by Johnney Killian, MD, Jeannie Done [phone])  on 03/10/2015 10:36:58 PM      MDM   Final diagnoses:  Iron deficiency anemia due to chronic blood loss  Chest pain, unspecified chest pain type  Hypokalemia   Patient presents to the ER for evaluation of low hemoglobin. Patient reports that she had blood work done in her primary care doctor's office that showed a hemoglobin of 7.5. She was told that she had any heart murmur as well and was sent to the ER. Patient also reports that she has been having chest pain. This has been occurring intermittently over the last 2 weeks. She reports that the pain comes on randomly. It lasts for approximately an hour or so and then goes away. She has not identified any factors that cause the pain other than stress. She reports  that she lost her mother last year and still having difficulty with grief. Patient's only cardiac risk factor is hypertension. She is very low risk for cardiac etiology of her pain. She has had 2 troponins are negative and an EKG that does not show any acute abnormality. I do not hear a murmur today. I did serial cardiac exams. Do not feel that she needs any further inpatient workup. She can have outpatient echo performed by her primary doctor.  Patient does have a hemoglobin of 9.2 today. Reviewing her records reveals that her hemoglobin ranges from 9-11. She does have heavy menstrual cycles. 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 She was placed on iron supplementation. She is going to follow-up with a new OB/GYN doctor in the next couple of weeks. She did have low potassium. This has been chronic for her in the past. She will be prescribed potassium supplementation.  I personally performed the services described in this documentation, which was scribed in my presence. The recorded information has been reviewed and is accurate.       Crystal Greek, MD 03/11/15 [phone]

## 2015-04-01 ENCOUNTER — Encounter: Payer: Self-pay | Admitting: Certified Nurse Midwife

## 2015-04-01 ENCOUNTER — Encounter: Payer: Self-pay | Admitting: Genetic Counselor

## 2015-04-01 ENCOUNTER — Ambulatory Visit (INDEPENDENT_AMBULATORY_CARE_PROVIDER_SITE_OTHER): Payer: Medicaid Other | Admitting: Certified Nurse Midwife

## 2015-04-01 VITALS — BP 123/83 | HR 76 | Wt 174.0 lb

## 2015-04-01 DIAGNOSIS — Z113 Encounter for screening for infections with a predominantly sexual mode of transmission: Secondary | ICD-10-CM

## 2015-04-01 DIAGNOSIS — Z01419 Encounter for gynecological examination (general) (routine) without abnormal findings: Secondary | ICD-10-CM | POA: Diagnosis not present

## 2015-04-01 DIAGNOSIS — Z862 Personal history of diseases of the blood and blood-forming organs and certain disorders involving the immune mechanism: Secondary | ICD-10-CM

## 2015-04-01 DIAGNOSIS — Z803 Family history of malignant neoplasm of breast: Secondary | ICD-10-CM

## 2015-04-01 DIAGNOSIS — B373 Candidiasis of vulva and vagina: Secondary | ICD-10-CM

## 2015-04-01 DIAGNOSIS — Z0000 Encounter for general adult medical examination without abnormal findings: Secondary | ICD-10-CM | POA: Diagnosis not present

## 2015-04-01 DIAGNOSIS — N920 Excessive and frequent menstruation with regular cycle: Secondary | ICD-10-CM

## 2015-04-01 DIAGNOSIS — A630 Anogenital (venereal) warts: Secondary | ICD-10-CM

## 2015-04-01 DIAGNOSIS — B3731 Acute candidiasis of vulva and vagina: Secondary | ICD-10-CM

## 2015-04-01 DIAGNOSIS — N939 Abnormal uterine and vaginal bleeding, unspecified: Secondary | ICD-10-CM

## 2015-04-01 MED ORDER — VITAFOL GUMMIES 3.33-0.333-34.8 MG PO CHEW: 3.0000 | CHEWABLE_TABLET | Freq: Every day | ORAL | Status: DC

## 2015-04-01 MED ORDER — TERCONAZOLE 0.4 % VA CREA: 1.0000 | TOPICAL_CREAM | Freq: Every day | VAGINAL | Status: DC

## 2015-04-01 MED ORDER — FLUCONAZOLE 100 MG PO TABS: 100.0000 mg | ORAL_TABLET | Freq: Once | ORAL | Status: DC

## 2015-04-01 MED ORDER — SINECATECHINS 15 % EX OINT: 1.0000 "application " | TOPICAL_OINTMENT | Freq: Two times a day (BID) | CUTANEOUS | Status: DC

## 2015-04-01 NOTE — Progress Notes (Signed)
Patient ID: Crystal Edwards, female   DOB: [birth year], 40 y.o.   MRN: [id number]   Subjective:      Crystal Edwards is a 40 y.o. female here for a routine exam.  Current complaints: heart murmur, needs primary care physician referral.  Was in bereavement counseling, after a year was discharged.    Not currently sexually active.  Having heavy periods with multiple plum sized clots,  Anemia. Monthly periods, lasting 7 days, has spotting in between.  Has 70 year old son.   Does not desire any more children.  Denies any family hx of bleeding disorders.  Has lost mother to multiple forms of cancer in the past year, her pastor is also deceased from cancer.  Has been depressed.  Desires to continue in counseling.  Denies any suicidal/homicidal ideations.  Has not been to primary care in a while.  Has not been eating right, encouragement given.  Has had recent unintentional weight loss.  Desires full STD screening exam.    Personal health questionnaire:  Is patient Ashkenazi Jewish, have a family history of breast and/or ovarian cancer: yes Is there a family history of uterine cancer diagnosed at age < 31, gastrointestinal cancer, urinary tract cancer, family member who is a Field seismologist syndrome-associated carrier: yes Is the patient overweight and hypertensive, family history of diabetes, personal history of gestational diabetes, preeclampsia or PCOS: yes Is patient over 75, have PCOS,  family history of premature CHD under age 68, diabetes, smoke, have hypertension or peripheral artery disease:  no At any time, has a partner hit, kicked or otherwise hurt or frightened you?: no Over the past 2 weeks, have you felt down, depressed or hopeless?: no Over the past 2 weeks, have you felt little interest or pleasure in doing things?:sometimes   Gynecologic History Patient's last menstrual period was 03/16/2015. Contraception: abstinence Last Pap: 03/2014. Results were: normal Last mammogram: 04/01/15. Results  were: unknown  Obstetric History OB History  Gravida Para Term Preterm AB SAB TAB Ectopic Multiple Living  4 1 1  3 1    1     # Outcome Date GA Lbr Len/2nd Weight Sex Delivery Anes PTL Lv  4 Term 02/01/03    Berenice Bouton   Y  3 AB 01/12/00          2 SAB 01/12/00          1 AB 02/01/95              Past Medical History  Diagnosis Date  . HPV test positive   . Anemia   . Hypertension     Past Surgical History  Procedure Laterality Date  . Leep    . Cesarean section       Current outpatient prescriptions:  .  albuterol (PROVENTIL HFA;VENTOLIN HFA) 108 (90 Base) MCG/ACT inhaler, Inhale 2 puffs into the lungs every 4 (four) hours as needed for wheezing or shortness of breath., Disp: 1 Inhaler, Rfl: 0 .  cyclobenzaprine (FLEXERIL) 5 MG tablet, Take 5 mg by mouth 3 (three) times daily as needed for muscle spasms., Disp: , Rfl:  .  ferrous sulfate 325 (65 FE) MG tablet, Take 1 tablet (325 mg total) by mouth 3 (three) times daily with meals., Disp: 90 tablet, Rfl: 0 .  lisinopril-hydrochlorothiazide (PRINZIDE,ZESTORETIC) 10-12.5 MG tablet, Take 1 tablet by mouth daily., Disp: , Rfl: 5 .  naproxen sodium (ANAPROX) 220 MG tablet, Take 440 mg by mouth 2 (two) times daily as needed (headahce).,  Disp: , Rfl:  .  Omega-3 Fatty Acids (FISH OIL) 1000 MG CAPS, Take by mouth., Disp: , Rfl:  .  fluconazole (DIFLUCAN) 100 MG tablet, Take 1 tablet (100 mg total) by mouth once. Repeat dose in 48-72 hour., Disp: 3 tablet, Rfl: 0 .  ipratropium (ATROVENT) 0.06 % nasal spray, Place 2 sprays into both nostrils 4 (four) times daily. (Patient not taking: Reported on 03/11/2015), Disp: 15 mL, Rfl: 0 .  potassium chloride SA (K-DUR,KLOR-CON) 20 MEQ tablet, Take 1 tablet (20 mEq total) by mouth daily. (Patient not taking: Reported on 04/01/2015), Disp: 7 tablet, Rfl: 0 .  Prenatal Vit-Fe Phos-FA-Omega (VITAFOL GUMMIES) 3.33-0.333-34.8 MG CHEW, Chew 3 tablets by mouth daily., Disp: 90 tablet, Rfl: 12 .   Sinecatechins (VEREGEN) 15 % OINT, Apply 1 application topically 2 (two) times daily., Disp: 30 g, Rfl: 1 .  terconazole (TERAZOL 7) 0.4 % vaginal cream, Place 1 applicator vaginally at bedtime., Disp: 45 g, Rfl: 0 No Known Allergies  Social History  Substance Use Topics  . Smoking status: Never Smoker   . Smokeless tobacco: Not on file  . Alcohol Use: No    Family History  Problem Relation Age of Onset  . Cancer Mother   . Hypertension Mother   . Cancer Other   . Hypertension Other   . Diabetes Other       Review of Systems  Constitutional: negative for fatigue and weight loss Respiratory: negative for cough and wheezing Cardiovascular: negative for chest pain, fatigue and palpitations Gastrointestinal: negative for abdominal pain and change in bowel habits Musculoskeletal:negative for myalgias Neurological: negative for gait problems and tremors Behavioral/Psych: negative for abusive relationship, depression Endocrine: negative for temperature intolerance   Genitourinary:+ for abnormal menstrual periods, genital lesions,  Negative for hot flashes, sexual problems and vaginal discharge Integument/breast: negative for breast lump, breast tenderness, nipple discharge and skin lesion(s)    Objective:       BP 123/83 mmHg  Pulse 76  Wt 174 lb (78.926 kg)  LMP 03/16/2015 General:   alert  Skin:   no rash or abnormalities  Lungs:   clear to auscultation bilaterally  Heart:   regular rate and rhythm, S1, S2 normal, no murmur, click, rub or gallop  Breasts:   normal without suspicious masses, skin or nipple changes or axillary nodes  Abdomen:  normal findings: no organomegaly, soft, non-tender and no hernia  Pelvis:  External genitalia: normal general appearance, + genital warts Urinary system: urethral meatus normal and bladder without fullness, nontender Vaginal: normal without tenderness, induration or masses, + white chunky vaginal discharge Cervix: normal  appearance Adnexa: normal bimanual exam Uterus: anteverted and non-tender, slightly enlarged in size   Lab Review Urine pregnancy test Labs re2viewed yes Radiologic studies reviewed yes  50% of 45+ min visit spent on counseling and coordination of care.   Assessment:    Healthy female exam.   AUB ? Fibroids  Menorrhagia: ? Bleeding disorder  STD screening exam  H/O elevated blood pressures  Depression  H/O anemia  High risk family hx of multiple cancers  Vaginitis  Genital warts with H/O HPV  Plan:    Education reviewed: calcium supplements, depression evaluation, low fat, low cholesterol diet, safe sex/STD prevention, self breast exams, skin cancer screening and weight bearing exercise. Contraception: abstinence. Follow up in: 1 month.   Meds ordered this encounter  Medications  . Omega-3 Fatty Acids (FISH OIL) 1000 MG CAPS    Sig: Take by mouth.  Marland Kitchen  cyclobenzaprine (FLEXERIL) 5 MG tablet    Sig: Take 5 mg by mouth 3 (three) times daily as needed for muscle spasms.  . Sinecatechins (VEREGEN) 15 % OINT    Sig: Apply 1 application topically 2 (two) times daily.    Dispense:  30 g    Refill:  1  . Prenatal Vit-Fe Phos-FA-Omega (VITAFOL GUMMIES) 3.33-0.333-34.8 MG CHEW    Sig: Chew 3 tablets by mouth daily.    Dispense:  90 tablet    Refill:  12  . fluconazole (DIFLUCAN) 100 MG tablet    Sig: Take 1 tablet (100 mg total) by mouth once. Repeat dose in 48-72 hour.    Dispense:  3 tablet    Refill:  0  . terconazole (TERAZOL 7) 0.4 % vaginal cream    Sig: Place 1 applicator vaginally at bedtime.    Dispense:  45 g    Refill:  0   Orders Placed This Encounter  Procedures  . US Pelvis Complete    Standing Status: Future     Number of Occurrences:      Standing Expiration Date: 05/31/2016    Order Specific Question:  Reason for Exam (SYMPTOM  OR DIAGNOSIS REQUIRED)    Answer:  AUB, slightly enlarged uterous    Order Specific Question:  Preferred imaging location?     Answer:  Internal  . US Transvaginal Non-OB    Standing Status: Future     Number of Occurrences:      Standing Expiration Date: 05/31/2016    Order Specific Question:  Reason for Exam (SYMPTOM  OR DIAGNOSIS REQUIRED)    Answer:  aub, enlarged uterous    Order Specific Question:  Preferred imaging location?    Answer:  Internal  . NuSwab Vaginitis Plus (VG+)  . HIV antibody (with reflex)  . Hepatitis B surface antigen  . RPR  . Hepatitis C antibody  . CBC with Differential/Platelet  . Comprehensive metabolic panel  . TSH  . IBC panel  . Ferritin  . Von Willebrand antigen  . Factor 10 assay  . Factor 2 assay  . Factor 5 assay  . Factor 7 assay  . Factor 11 assay  . Factor 12 assay  . Factor 8 assay  . Factor 9 assay  . PTT  . Ambulatory referral to Internal Medicine    Referral Priority:  Routine    Referral Type:  Consultation    Referral Reason:  Specialty Services Required    Requested Specialty:  Internal Medicine    Number of Visits Requested:  1  . Ambulatory referral to Genetics    Referral Priority:  Routine    Referral Type:  Consultation    Referral Reason:  Specialty Services Required    Number of Visits Requested:  1    Possible management options include: uterine ablation, hysterscopy if fibroids with IUD insertion, Mirena IUD Follow up after pelvic US.

## 2015-04-03 LAB — COMPREHENSIVE METABOLIC PANEL
ALK PHOS: 62 IU/L (ref 39–117)
ALT: 10 IU/L (ref 0–32)
AST: 11 IU/L (ref 0–40)
Albumin/Globulin Ratio: 1.4 (ref 1.2–2.2)
Albumin: 4.2 g/dL (ref 3.5–5.5)
BUN/Creatinine Ratio: 14 (ref 8–20)
BUN: 9 mg/dL (ref 6–20)
Bilirubin Total: 0.3 mg/dL (ref 0.0–1.2)
CHLORIDE: 98 mmol/L (ref 96–106)
CO2: 24 mmol/L (ref 18–29)
Calcium: 9.3 mg/dL (ref 8.7–10.2)
Creatinine, Ser: 0.63 mg/dL (ref 0.57–1.00)
GFR calc Af Amer: 131 mL/min/{1.73_m2} (ref >59)
GFR calc non Af Amer: 113 mL/min/{1.73_m2} (ref >59)
GLUCOSE: 89 mg/dL (ref 65–99)
Globulin, Total: 3.1 g/dL (ref 1.5–4.5)
Potassium: 4.1 mmol/L (ref 3.5–5.2)
Sodium: 139 mmol/L (ref 134–144)
Total Protein: 7.3 g/dL (ref 6.0–8.5)

## 2015-04-03 LAB — FACTOR 12 ASSAY: Factor XII Activity: 100 % (ref 50–150)

## 2015-04-03 LAB — NUSWAB VAGINITIS PLUS (VG+)
Atopobium vaginae: HIGH Score — AB
CANDIDA ALBICANS, NAA: NEGATIVE
CHLAMYDIA TRACHOMATIS, NAA: NEGATIVE
Candida glabrata, NAA: NEGATIVE
Neisseria gonorrhoeae, NAA: NEGATIVE
TRICH VAG BY NAA: NEGATIVE

## 2015-04-03 LAB — FACTOR 2 ASSAY: FACTOR II ACTIVITY: 134 % (ref 50–154)

## 2015-04-03 LAB — FACTOR 7 ASSAY: Factor VII Activity: 111 % (ref 51–186)

## 2015-04-03 LAB — FACTOR 10 ASSAY: FACTOR X ACTIVITY: 136 % (ref 76–183)

## 2015-04-03 LAB — CBC WITH DIFFERENTIAL/PLATELET
BASOS ABS: 0 10*3/uL (ref 0.0–0.2)
Basos: 0 %
EOS (ABSOLUTE): 0.1 10*3/uL (ref 0.0–0.4)
Eos: 2 %
HEMOGLOBIN: 9.9 g/dL — AB (ref 11.1–15.9)
Hematocrit: 32.8 % — ABNORMAL LOW (ref 34.0–46.6)
Immature Grans (Abs): 0 10*3/uL (ref 0.0–0.1)
Immature Granulocytes: 0 %
LYMPHS ABS: 1.7 10*3/uL (ref 0.7–3.1)
Lymphs: 34 %
MCH: 23.2 pg — ABNORMAL LOW (ref 26.6–33.0)
MCHC: 30.2 g/dL — AB (ref 31.5–35.7)
MCV: 77 fL — ABNORMAL LOW (ref 79–97)
MONOCYTES: 7 %
MONOS ABS: 0.3 10*3/uL (ref 0.1–0.9)
Neutrophils Absolute: 2.8 10*3/uL (ref 1.4–7.0)
Neutrophils: 57 %
Platelets: 343 10*3/uL (ref 150–379)
RBC: 4.27 x10E6/uL (ref 3.77–5.28)
RDW: 21.4 % — AB (ref 12.3–15.4)
WBC: 4.9 10*3/uL (ref 3.4–10.8)

## 2015-04-03 LAB — FACTOR 5 ASSAY: Factor V Activity: 94 % (ref 70–150)

## 2015-04-03 LAB — FERRITIN: FERRITIN: 14 ng/mL — AB (ref 15–150)

## 2015-04-03 LAB — RPR: RPR Ser Ql: NONREACTIVE

## 2015-04-03 LAB — APTT: APTT: 28 s (ref 24–33)

## 2015-04-03 LAB — TSH: TSH: 1.49 u[IU]/mL (ref 0.450–4.500)

## 2015-04-03 LAB — FACTOR 8 ASSAY: Factor VIII Activity: 110 % (ref 57–163)

## 2015-04-03 LAB — FACTOR 9 ASSAY: Factor IX Activity: 138 % (ref 60–177)

## 2015-04-03 LAB — VON WILLEBRAND ANTIGEN: Von Willebrand Ag: 83 % (ref 50–200)

## 2015-04-03 LAB — HEPATITIS C ANTIBODY

## 2015-04-03 LAB — HEPATITIS B SURFACE ANTIGEN: Hepatitis B Surface Ag: NEGATIVE

## 2015-04-03 LAB — FACTOR 11 ASSAY: Factor XI Activity: 88 % (ref 60–150)

## 2015-04-03 LAB — HIV ANTIBODY (ROUTINE TESTING W REFLEX): HIV Screen 4th Generation wRfx: NONREACTIVE

## 2015-04-04 LAB — PAP IG AND HPV HIGH-RISK
HPV, high-risk: NEGATIVE
PAP SMEAR COMMENT: 0

## 2015-04-04 LAB — IRON AND TIBC
IRON: 39 ug/dL (ref 27–159)
Iron Saturation: 11 % — ABNORMAL LOW (ref 15–55)
Total Iron Binding Capacity: 357 ug/dL (ref 250–450)
UIBC: 318 ug/dL (ref 131–425)

## 2015-04-04 LAB — SPECIMEN STATUS REPORT

## 2015-04-08 ENCOUNTER — Institutional Professional Consult (permissible substitution): Payer: Medicaid Other

## 2015-04-08 ENCOUNTER — Other Ambulatory Visit: Payer: Self-pay | Admitting: Certified Nurse Midwife

## 2015-04-08 DIAGNOSIS — D508 Other iron deficiency anemias: Secondary | ICD-10-CM

## 2015-04-08 MED ORDER — VITAFOL FE+ 90-1-200 & 50 MG PO CPPK: 2.0000 | ORAL_CAPSULE | Freq: Every day | ORAL | Status: DC

## 2015-04-21 ENCOUNTER — Ambulatory Visit (INDEPENDENT_AMBULATORY_CARE_PROVIDER_SITE_OTHER): Payer: Medicaid Other

## 2015-04-21 DIAGNOSIS — N939 Abnormal uterine and vaginal bleeding, unspecified: Secondary | ICD-10-CM | POA: Diagnosis not present

## 2015-04-22 ENCOUNTER — Ambulatory Visit (INDEPENDENT_AMBULATORY_CARE_PROVIDER_SITE_OTHER): Payer: Medicaid Other | Admitting: Certified Nurse Midwife

## 2015-04-22 ENCOUNTER — Encounter: Payer: Self-pay | Admitting: Certified Nurse Midwife

## 2015-04-22 VITALS — BP 131/87 | HR 72 | Temp 98.3°F | Wt 176.0 lb

## 2015-04-22 DIAGNOSIS — Z09 Encounter for follow-up examination after completed treatment for conditions other than malignant neoplasm: Secondary | ICD-10-CM

## 2015-04-22 DIAGNOSIS — Z1389 Encounter for screening for other disorder: Secondary | ICD-10-CM | POA: Diagnosis not present

## 2015-04-22 DIAGNOSIS — N939 Abnormal uterine and vaginal bleeding, unspecified: Secondary | ICD-10-CM

## 2015-04-22 DIAGNOSIS — D509 Iron deficiency anemia, unspecified: Secondary | ICD-10-CM | POA: Diagnosis not present

## 2015-04-22 LAB — POCT URINALYSIS DIPSTICK
Bilirubin, UA: NEGATIVE
Glucose, UA: NEGATIVE
KETONES UA: NEGATIVE
Leukocytes, UA: NEGATIVE
Nitrite, UA: NEGATIVE
PH UA: 5
PROTEIN UA: NEGATIVE
SPEC GRAV UA: 1.01
Urobilinogen, UA: NEGATIVE

## 2015-04-22 MED ORDER — TRANEXAMIC ACID 650 MG PO TABS: 1300.0000 mg | ORAL_TABLET | Freq: Three times a day (TID) | ORAL | Status: DC

## 2015-04-22 NOTE — Progress Notes (Signed)
Patient ID: Crystal Edwards, female   DOB: 08/10/1975, 40 y.o.   MRN: [id number]  Chief Complaint  Patient presents with  . Follow-up    HPI Crystal Edwards is a 40 y.o. female.  Here for f/u on AUB and ultrasound performed on 04/21/15.  Results reviewed with patient.  Demonstrated a uterine lining WNL of 11 mm.  Probable 4 small fibroids present, with ?small endometrial mass.  Probably adenomyosis.  Discussed the possibility after biopsy of having endometrial ablation.  Has genetics counseling appointment scheduled for 4/18.  Patient was very anxious about her results.  Reassurance given.      HPI  Past Medical History  Diagnosis Date  . HPV test positive   . Anemia   . Hypertension     Past Surgical History  Procedure Laterality Date  . Leep    . Cesarean section      Family History  Problem Relation Age of Onset  . Cancer Mother   . Hypertension Mother   . Cancer Other   . Hypertension Other   . Diabetes Other     Social History Social History  Substance Use Topics  . Smoking status: Never Smoker   . Smokeless tobacco: None  . Alcohol Use: No    No Known Allergies  Current Outpatient Prescriptions  Medication Sig Dispense Refill  . albuterol (PROVENTIL HFA;VENTOLIN HFA) 108 (90 Base) MCG/ACT inhaler Inhale 2 puffs into the lungs every 4 (four) hours as needed for wheezing or shortness of breath. 1 Inhaler 0  . cyclobenzaprine (FLEXERIL) 5 MG tablet Take 5 mg by mouth 3 (three) times daily as needed for muscle spasms.    . ferrous sulfate 325 (65 FE) MG tablet Take 1 tablet (325 mg total) by mouth 3 (three) times daily with meals. 90 tablet 0  . fluconazole (DIFLUCAN) 100 MG tablet Take 1 tablet (100 mg total) by mouth once. Repeat dose in 48-72 hour. 3 tablet 0  . lisinopril-hydrochlorothiazide (PRINZIDE,ZESTORETIC) 10-12.5 MG tablet Take 1 tablet by mouth daily.  5  . naproxen sodium (ANAPROX) 220 MG tablet Take 440 mg by mouth 2 (two) times daily as needed  (headahce).    . Omega-3 Fatty Acids (FISH OIL) 1000 MG CAPS Take by mouth.    . Prenat-FePoly-Metf-FA-DHA-DSS (VITAFOL FE+) 90-1-200 & 50 MG CPPK Take 2 tablets by mouth daily. 60 each 12  . Prenatal Vit-Fe Phos-FA-Omega (VITAFOL GUMMIES) 3.33-0.333-34.8 MG CHEW Chew 3 tablets by mouth daily. 90 tablet 12  . Sinecatechins (VEREGEN) 15 % OINT Apply 1 application topically 2 (two) times daily. 30 g 1  . terconazole (TERAZOL 7) 0.4 % vaginal cream Place 1 applicator vaginally at bedtime. 45 g 0  . tranexamic acid (LYSTEDA) 650 MG TABS tablet Take 2 tablets (1,300 mg total) by mouth 3 (three) times daily. For the first 5 days of your period. 30 tablet 1   No current facility-administered medications for this visit.    Review of Systems Review of Systems Constitutional: negative for fatigue and weight loss Respiratory: negative for cough and wheezing Cardiovascular: negative for chest pain, fatigue and palpitations Gastrointestinal: negative for abdominal pain and change in bowel habits Genitourinary: +AUB Integument/breast: negative for nipple discharge Musculoskeletal:negative for myalgias Neurological: negative for gait problems and tremors Behavioral/Psych: negative for abusive relationship, depression Endocrine: negative for temperature intolerance     Blood pressure 131/87, pulse 72, temperature 98.3 F (36.8 C), weight 176 lb (79.833 kg), last menstrual period 03/16/2015.  Physical Exam Physical  Exam   50% of 15 min visit spent on counseling and coordination of care.   Data Reviewed Previous medical hx, meds, [place]  Assessment     AUB      Plan    Orders Placed This Encounter  Procedures  . [place] Sonohysterogram    Standing Status: Future     Number of Occurrences:      Standing Expiration Date: 06/21/2016    Order Specific Question:  Reason for Exam (SYMPTOM  OR DIAGNOSIS REQUIRED)    Answer:  with endometrial biopsy d/t endometrial thickening ? mass    Order Specific  Question:  Preferred imaging location?    Answer:  Internal  . POCT urinalysis dipstick   Meds ordered this encounter  Medications  . tranexamic acid (LYSTEDA) 650 MG TABS tablet    Sig: Take 2 tablets (1,300 mg total) by mouth 3 (three) times daily. For the first 5 days of your period.    Dispense:  30 tablet    Refill:  1    Need to obtain previous records Possible management options include: endometrial ablation Follow up with sonohysterography and endometrial biopsy.

## 2015-04-22 NOTE — Patient Instructions (Signed)

## 2015-04-24 ENCOUNTER — Ambulatory Visit: Payer: Medicaid Other | Admitting: Certified Nurse Midwife

## 2015-04-24 ENCOUNTER — Other Ambulatory Visit: Payer: Medicaid Other

## 2015-04-29 ENCOUNTER — Encounter: Payer: Self-pay | Admitting: Genetic Counselor

## 2015-04-29 ENCOUNTER — Other Ambulatory Visit: Payer: Medicaid Other

## 2015-04-29 ENCOUNTER — Ambulatory Visit (HOSPITAL_BASED_OUTPATIENT_CLINIC_OR_DEPARTMENT_OTHER): Payer: Medicaid Other | Admitting: Genetic Counselor

## 2015-04-29 DIAGNOSIS — Z801 Family history of malignant neoplasm of trachea, bronchus and lung: Secondary | ICD-10-CM | POA: Diagnosis not present

## 2015-04-29 DIAGNOSIS — Z315 Encounter for genetic counseling: Secondary | ICD-10-CM

## 2015-04-29 DIAGNOSIS — Z8042 Family history of malignant neoplasm of prostate: Secondary | ICD-10-CM

## 2015-04-29 DIAGNOSIS — Z803 Family history of malignant neoplasm of breast: Secondary | ICD-10-CM | POA: Insufficient documentation

## 2015-04-29 DIAGNOSIS — Z800 Family history of malignant neoplasm of digestive organs: Secondary | ICD-10-CM

## 2015-04-29 NOTE — Progress Notes (Signed)
REFERRING PROVIDER: Morene Crocker, CNM [address] [place], [place] [address]  PRIMARY PROVIDER:  GENERAL MEDICAL CLINIC  PRIMARY REASON FOR VISIT:  1. Family history of breast cancer in female   2. Family history of colon cancer   3. Family history of prostate cancer   4. Family history of liver cancer   5. Family history of lung cancer      HISTORY OF PRESENT ILLNESS:   Ms. Crystal Edwards, a 40 y.o. female, was seen for a [facility] cancer genetics consultation at the request of Kandis Cocking due to a family history of early-onset breast, prostate, and other cancers.  [name] presents to clinic today to discuss the possibility of a hereditary predisposition to cancer, genetic testing, and to further clarify her future cancer risks, as well as potential cancer risks for family members.    [name] is a 40 y.o. female with no personal history of cancer.     HORMONAL RISK FACTORS:  Menarche was at age 22.  First live birth at age 48.  OCP use for approximately 6 mos-1 year.  Ovaries intact: yes.  Hysterectomy: no, but currently has a mass on her uterus which will be biopsied this Thursday (April 20th) Menopausal status: premenopausal.  HRT use: 0 years. Colonoscopy: n/a; not examined. Mammogram within the last year: yes. Number of breast biopsies: 0. Up to date with pelvic exams:  yes. Any excessive radiation exposure in the past:  no, but does report history of secondhand smoke exposure (family members were smokers)  Past Medical History  Diagnosis Date  . HPV test positive   . Anemia   . Hypertension     Past Surgical History  Procedure Laterality Date  . Leep    . Cesarean section      Social History   Social History  . Marital Status: Single    Spouse Name: N/A  . Number of Children: N/A  . Years of Education: N/A   Social History Main Topics  . Smoking status: Never Smoker   . Smokeless tobacco: Never Used  . Alcohol Use: No  .  Drug Use: No  . Sexual Activity: No   Other Topics Concern  . None   Social History Narrative     FAMILY HISTORY:  We obtained a detailed, 4-generation family history.  Significant diagnoses are listed below: Family History  Problem Relation Age of Onset  . Hypertension Mother   . Breast cancer Mother 37  . Lung cancer Mother 82    smoker  . Fibroids Mother     +TAH  . Cancer Other   . Hypertension Other   . Diabetes Other   . Breast cancer Maternal Aunt 70  . Prostate cancer Maternal Uncle 58  . Colon cancer Maternal Grandmother 70  . Breast cancer Maternal Grandmother     dx. 82-85  . Lung cancer Paternal Grandmother     d. 75; smoker    [name] has one son who is currently 43.  She has one full brother who is currently 30 and who has never had cancer.  He has no children.  [name]'s mother died of lung cancer at the age of 23; she had a history of smoking.  She was diagnosed with breast cancer at the age of 47.  She also had a history of fibroids for which she underwent a hysterectomy.  [name]'s father died of "double pneumonia" and cardiac arrest at the age  of 44.    [name]'s mother had five full sisters, five full brothers, and two maternal half-sisters.  Both half-sisters are in their early 54s and have never had cancer.  One sister was diagnosed with breast cancer at 73.  She has one son and daughter who have not had cancer.  One sister died of heart disease at 66.  The other three full sisters are ages 76-65 and have never had cancer.  One brother died of liver cancer at 33; he had a history of alcohol abuse.  Another brother was diagnosed with prostate cancer at 11.  He has two sons who have not had cancer.  Three other brothers are in their 50s-60s and have not had cancer.  [name]'s maternal grandmother is currently 27.  She was diagnosed with colon cancer at 24 and with breast cancer between 82-85.  [name] has no information for  her maternal grandfather.  She has no further information for any maternal great aunts/uncles or great grandparents.    [name]'s father had three full sisters and one full brother.  Two sisters are in their early-mid-60s and have not had cancer.  One sister was killed at the age of 17.  His brother was also killed at the age of 33-30.  [name] reports no history of cancer in any paternal first cousins.  Her paternal grandmother died of lung cancer at 62; she was a smoker.  Her grandfather died of lupus at 41.  She has no further information for any paternal great aunts/uncles or great grandparents.  She reports no known family history of genetic testing for hereditary cancer.  Patient's maternal ancestors are of Native [place] and [place] American descent, and paternal ancestors are of [place] American and [place] descent. There is no reported Ashkenazi Jewish ancestry. There is no known consanguinity.  GENETIC COUNSELING ASSESSMENT: [name] is a 39 y.o. female with a family history of early-onset breast cancer which somewhat suggestive of a hereditary breast cancer syndrome and predisposition to cancer. We, therefore, discussed and recommended the following at today's visit.   DISCUSSION: We reviewed the characteristics, features and inheritance patterns of hereditary cancer syndromes, particularly those caused by mutations within the BRCA1/2 and CHEK2 genes. We also discussed genetic testing, including the appropriate family members to test, the process of testing, insurance coverage and turn-around-time for results. We discussed the implications of a negative, positive and/or variant of uncertain significant result. We recommended [name] pursue genetic testing for the 20-gene Breast/Ovarian Cancer Panel through [facility].  The Breast/Ovarian Cancer Panel offered by [facility] [name], MD) includes sequencing and deletion/duplication  analysis for the following 19 genes:  ATM, BARD1, BRCA1, BRCA2, BRIP1, CDH1, CHEK2, FANCC, MLH1, MSH2, MSH6, NBN, PALB2, PMS2, PTEN, RAD51C, RAD51D, TP53, and XRCC2.  This panel also includes deletion/duplication analysis (without sequencing) for one gene, EPCAM.  Based on the patient's personal and family history, a statistical model (IBIS/Tyrer-Cuzick)  and literature data were used to estimate her risk of developing breast cancer. These estimate her lifetime risk of developing breast cancer to be approximately 33.3%. This estimation does not take into account any genetic testing results.  The patient's lifetime breast cancer risk is a preliminary estimate based on available information using one of several models endorsed by the [place] (ACS). The ACS recommends consideration of breast MRI screening as an adjunct to mammography for patients at high risk (defined as 20% or greater lifetime risk). A more  detailed breast cancer risk assessment can be considered, if clinically indicated.   PLAN: After considering the risks, benefits, and limitations, Ms. Bene  provided informed consent to pursue genetic testing and the blood sample was sent to [facility] for analysis of the 20-gene Breast/Ovarian Cancer Panel. Results should be available within approximately 2-3 weeks' time, at which point they will be disclosed by telephone to Ms. Haisley, as will any additional recommendations warranted by these results. Ms. Mcmackin will receive a summary of her genetic counseling visit and a copy of her results once available. This information will also be available in Epic. We encouraged Ms. Shere to remain in contact with cancer genetics annually so that we can continuously update the family history and inform her of any changes in cancer genetics and testing that may be of benefit for her family. Ms. Macphee's questions were answered to her satisfaction today. Our contact information  was provided should additional questions or concerns arise.  Thank you for the referral and allowing [place] to share in the care of your patient.   Jeanine Luz, MS, [facility] Certified Genetic Counselor [email or web address] Phone: [phone]  The patient was seen for a total of 45 minutes in face-to-face genetic counseling.  This patient was discussed with Drs. Magrinat, Lindi Adie and/or Burr Medico who agrees with the above.    _______________________________________________________________________ For Office Staff:  Number of people involved in session: 1 Was an Intern/ student involved with case: no

## 2015-05-01 ENCOUNTER — Other Ambulatory Visit: Payer: Self-pay | Admitting: Certified Nurse Midwife

## 2015-05-01 ENCOUNTER — Encounter: Payer: Self-pay | Admitting: Certified Nurse Midwife

## 2015-05-01 ENCOUNTER — Ambulatory Visit (INDEPENDENT_AMBULATORY_CARE_PROVIDER_SITE_OTHER): Payer: Medicaid Other

## 2015-05-01 ENCOUNTER — Ambulatory Visit (INDEPENDENT_AMBULATORY_CARE_PROVIDER_SITE_OTHER): Payer: Medicaid Other | Admitting: Certified Nurse Midwife

## 2015-05-01 VITALS — BP 112/77 | HR 77 | Wt 184.0 lb

## 2015-05-01 DIAGNOSIS — Z09 Encounter for follow-up examination after completed treatment for conditions other than malignant neoplasm: Secondary | ICD-10-CM

## 2015-05-01 DIAGNOSIS — Z01818 Encounter for other preprocedural examination: Secondary | ICD-10-CM

## 2015-05-01 DIAGNOSIS — N920 Excessive and frequent menstruation with regular cycle: Secondary | ICD-10-CM | POA: Diagnosis not present

## 2015-05-01 DIAGNOSIS — N939 Abnormal uterine and vaginal bleeding, unspecified: Secondary | ICD-10-CM | POA: Diagnosis not present

## 2015-05-01 DIAGNOSIS — Z3202 Encounter for pregnancy test, result negative: Secondary | ICD-10-CM

## 2015-05-01 DIAGNOSIS — D509 Iron deficiency anemia, unspecified: Secondary | ICD-10-CM

## 2015-05-01 LAB — POCT URINE PREGNANCY: Preg Test, Ur: NEGATIVE

## 2015-05-01 NOTE — Progress Notes (Signed)
Patient ID: Crystal Edwards, female   DOB: 1975/05/06, 40 y.o.   MRN: [id number]  Endometrial Biopsy Procedure Note  Pre-operative Diagnosis: Menorrhagia, Dysmenorrhea, Endometrial mass   Post-operative Diagnosis: same  Indications: abnormal uterine bleeding, endometrial mass: ?polyp  Procedure Details   Urine pregnancy test was done 05/01/15 and result was negative.  The risks (including infection, bleeding, pain, and uterine perforation) and benefits of the procedure were explained to the patient and Verbal informed consent was obtained.    The patient was placed in the dorsal lithotomy position.  Bimanual exam showed the uterus to be in the retroflexed position.  A Graves' speculum inserted in the vagina, and the cervix prepped with povidone iodine.  Endocervical curettage with a Kevorkian curette was performed.   A sharp tenaculum was not required.  A sterile uterine sound was used to sound the uterus to a depth of 8cm.  A Pipelle endometrial aspirator was used to sample the endometrium.  Sample was sent for pathologic examination.  Condition: Stable  Complications: None  Plan:  The patient was advised to call for any fever or for prolonged or severe pain or bleeding. She was advised to use OTC ibuprofen as needed for mild to moderate pain. She was advised to avoid vaginal intercourse for 48 hours or until the bleeding has completely stopped.  Attending Physician Documentation: Dr. Jodi Mourning was available for the exam/procedure.

## 2015-05-12 ENCOUNTER — Telehealth: Payer: Self-pay | Admitting: Genetic Counselor

## 2015-05-12 NOTE — Telephone Encounter (Signed)
Discussed with Ms. Bedonie that her genetic test result was negative for any known pathogenic mutations within any of 20 genes on the Breast/Ovarian Cancer Panel that would increase her genetic risk for breast, ovarian, and other related cancers.  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  Discussed that we treat this just like a negative test result until it gets updated by the laboratory and reviewed why we do that.  Encouraged Ms. Volner to keep her phone number up-to-date with [place] in the future so that we can call and let her know if/when this gets updated by the lab.  Discussed that Ms. Mcneary should continue to follow her doctors' recommendations for future cancer screening.  Discussed that two risk models Baker Janus Model and IBIS/Tyrer-Cuzick models) demonstrate her lifetime breast cancer risk to be approximately 15-33%.  Discussed that consideration for breast MRIs is generally recommended for those women with a lifetime breast cancer of 20% or greater.  Recommended she discuss with her doctors whether or not she should be receiving any additional screening such as breast MRIs.  Discussed that there are some reassuring characteristics about the family history--her mother is the only relative who has had early-onset breast cancer, many of the other cancers were diagnosed at later ages.  Discussed that Ms. Cahalan's brother could still have genetic testing, if he is interested.  Ms. Dalgleish is welcome to contact me with any further questions.  She would like a copy of her test result and I will send that to her in a secure email along with a results letter.

## 2015-05-13 ENCOUNTER — Ambulatory Visit: Payer: Self-pay | Admitting: Genetic Counselor

## 2015-05-13 DIAGNOSIS — Z809 Family history of malignant neoplasm, unspecified: Secondary | ICD-10-CM

## 2015-05-13 DIAGNOSIS — Z800 Family history of malignant neoplasm of digestive organs: Secondary | ICD-10-CM

## 2015-05-13 DIAGNOSIS — Z803 Family history of malignant neoplasm of breast: Secondary | ICD-10-CM

## 2015-05-13 DIAGNOSIS — Z1379 Encounter for other screening for genetic and chromosomal anomalies: Secondary | ICD-10-CM

## 2015-05-14 ENCOUNTER — Ambulatory Visit (INDEPENDENT_AMBULATORY_CARE_PROVIDER_SITE_OTHER): Payer: Medicaid Other | Admitting: Obstetrics

## 2015-05-14 VITALS — BP 110/73 | HR 81 | Wt 178.0 lb

## 2015-05-14 DIAGNOSIS — N939 Abnormal uterine and vaginal bleeding, unspecified: Secondary | ICD-10-CM

## 2015-05-14 DIAGNOSIS — N840 Polyp of corpus uteri: Secondary | ICD-10-CM

## 2015-05-14 DIAGNOSIS — D509 Iron deficiency anemia, unspecified: Secondary | ICD-10-CM | POA: Diagnosis not present

## 2015-05-14 DIAGNOSIS — D251 Intramural leiomyoma of uterus: Secondary | ICD-10-CM

## 2015-05-14 NOTE — Patient Instructions (Signed)
Uterine Fibroids Uterine fibroids are tissue masses (tumors) that can develop in the womb (uterus). They are also called leiomyomas. This type of tumor is not cancerous (benign) and does not spread to other parts of the body outside of the pelvic area, which is between the hip bones. Occasionally, fibroids may develop in the fallopian tubes, in the cervix, or on the support structures (ligaments) that surround the uterus. You can have one or many fibroids. Fibroids can vary in size, weight, and where they grow in the uterus. Some can become quite large. Most fibroids do not require medical treatment. CAUSES A fibroid can develop when a single uterine cell keeps growing (replicating). Most cells in the human body have a control mechanism that keeps them from replicating without control. SIGNS AND SYMPTOMS Symptoms may include:   Heavy bleeding during your period.  Bleeding or spotting between periods.  Pelvic pain and pressure.  Bladder problems, such as needing to urinate more often (urinary frequency) or urgently.  Inability to reproduce offspring (infertility).  Miscarriages. DIAGNOSIS Uterine fibroids are diagnosed through a physical exam. Your health care provider may feel the lumpy tumors during a pelvic exam. Ultrasonography and an MRI may be done to determine the size, location, and number of fibroids. TREATMENT Treatment may include:  Watchful waiting. This involves getting the fibroid checked by your health care provider to see if it grows or shrinks. Follow your health care provider's recommendations for how often to have this checked.  Hormone medicines. These can be taken by mouth or given through an intrauterine device (IUD).  Surgery.  Removing the fibroids (myomectomy) or the uterus (hysterectomy).  Removing blood supply to the fibroids (uterine artery embolization). If fibroids interfere with your fertility and you want to become pregnant, your health care provider  may recommend having the fibroids removed.  HOME CARE INSTRUCTIONS  Keep all follow-up visits as directed by your health care provider. This is important.  Take medicines only as directed by your health care provider.  If you were prescribed a hormone treatment, take the hormone medicines exactly as directed.  Do not take aspirin, because it can cause bleeding.  Ask your health care provider about taking iron pills and increasing the amount of dark green, leafy vegetables in your diet. These actions can help to boost your blood iron levels, which may be affected by heavy menstrual bleeding.  Pay close attention to your period and tell your health care provider about any changes, such as:  Increased blood flow that requires you to use more pads or tampons than usual per month.  A change in the number of days that your period lasts per month.  A change in symptoms that are associated with your period, such as abdominal cramping or back pain. SEEK MEDICAL CARE IF:  You have pelvic pain, back pain, or abdominal cramps that cannot be controlled with medicines.  You have an increase in bleeding between and during periods.  You soak tampons or pads in a half hour or less.  You feel lightheaded, extra tired, or weak. SEEK IMMEDIATE MEDICAL CARE IF:  You faint.  You have a sudden increase in pelvic pain.   This information is not intended to replace advice given to you by your health care provider. Make sure you discuss any questions you have with your health care provider.   Document Released: 12/26/1999 Document Revised: 01/18/2014 Document Reviewed: 06/26/2013 Elsevier Interactive Patient Education 2016 Elsevier Inc.  

## 2015-05-15 ENCOUNTER — Encounter: Payer: Self-pay | Admitting: Obstetrics

## 2015-05-15 NOTE — Progress Notes (Signed)
Patient ID: Crystal Edwards, female   DOB: October 01, 1975, 40 y.o.   MRN: [id number]  Chief Complaint  Patient presents with  . Results    Biopsy U/S    HPI Crystal Edwards is a 40 y.o. female.  H/O AUB.  S/P sonohysterogram with endometrial biopsy.  Presents for results and management recommendations.  HPI  Past Medical History  Diagnosis Date  . HPV test positive   . Anemia   . Hypertension     Past Surgical History  Procedure Laterality Date  . Leep    . Cesarean section      Family History  Problem Relation Age of Onset  . Hypertension Mother   . Breast cancer Mother 24  . Lung cancer Mother 68    smoker  . Fibroids Mother     +TAH  . Cancer Other   . Hypertension Other   . Diabetes Other   . Breast cancer Maternal Aunt 70  . Prostate cancer Maternal Uncle 58  . Colon cancer Maternal Grandmother 29  . Breast cancer Maternal Grandmother     dx. 82-85  . Lung cancer Paternal Grandmother     d. 58; smoker    Social History Social History  Substance Use Topics  . Smoking status: Never Smoker   . Smokeless tobacco: Never Used  . Alcohol Use: No    No Known Allergies  Current Outpatient Prescriptions  Medication Sig Dispense Refill  . albuterol (PROVENTIL HFA;VENTOLIN HFA) 108 (90 Base) MCG/ACT inhaler Inhale 2 puffs into the lungs every 4 (four) hours as needed for wheezing or shortness of breath. 1 Inhaler 0  . cyclobenzaprine (FLEXERIL) 5 MG tablet Take 5 mg by mouth 3 (three) times daily as needed for muscle spasms.    . ferrous sulfate 325 (65 FE) MG tablet Take 1 tablet (325 mg total) by mouth 3 (three) times daily with meals. 90 tablet 0  . lisinopril-hydrochlorothiazide (PRINZIDE,ZESTORETIC) 10-12.5 MG tablet Take 1 tablet by mouth daily.  5  . naproxen sodium (ANAPROX) 220 MG tablet Take 440 mg by mouth 2 (two) times daily as needed (headahce).    . Omega-3 Fatty Acids (FISH OIL) 1000 MG CAPS Take by mouth.    . Prenatal Vit-Fe Phos-FA-Omega  (VITAFOL GUMMIES) 3.33-0.333-34.8 MG CHEW Chew 3 tablets by mouth daily. 90 tablet 12  . Sinecatechins (VEREGEN) 15 % OINT Apply 1 application topically 2 (two) times daily. 30 g 1  . terconazole (TERAZOL 7) 0.4 % vaginal cream Place 1 applicator vaginally at bedtime. 45 g 0  . tranexamic acid (LYSTEDA) 650 MG TABS tablet Take 2 tablets (1,300 mg total) by mouth 3 (three) times daily. For the first 5 days of your period. 30 tablet 1   No current facility-administered medications for this visit.    Review of Systems Review of Systems Constitutional: negative for fatigue and weight loss Respiratory: negative for cough and wheezing Cardiovascular: negative for chest pain, fatigue and palpitations Gastrointestinal: negative for abdominal pain and change in bowel habits Genitourinary:positive for AUB Integument/breast: negative for nipple discharge Musculoskeletal:negative for myalgias Neurological: negative for gait problems and tremors Behavioral/Psych: negative for abusive relationship, depression Endocrine: negative for temperature intolerance     Blood pressure 110/73, pulse 81, weight 178 lb (80.74 kg).  Physical Exam Physical Exam:  Deferred  100% of 10 min visit spent on counseling and coordination of care.   Data Reviewed Pathology  Assessment     AUB Large endometrial polyp.  Plan    Discussed management options, including polypectomy. F/U in 2 weeks.   No orders of the defined types were placed in this encounter.   No orders of the defined types were placed in this encounter.

## 2015-05-19 ENCOUNTER — Encounter: Payer: Self-pay | Admitting: Genetic Counselor

## 2015-05-26 DIAGNOSIS — Z1379 Encounter for other screening for genetic and chromosomal anomalies: Secondary | ICD-10-CM | POA: Insufficient documentation

## 2015-05-26 NOTE — Progress Notes (Signed)
GENETIC TEST RESULT  HPI: Ms. Becht was previously seen in the [place] clinic due to a family history of early-onset breast cancer in her mother, family history of colon, prostate, and other cancers, and concerns regarding a hereditary predisposition to cancer. Please refer to our prior cancer genetics clinic note from April 29, 2015 for more information regarding Ms. Janota's medical, social and family histories, and our assessment and recommendations, at the time. Ms. Sturdy's recent genetic test results were disclosed to her, as were recommendations warranted by these results. These results and recommendations are discussed in more detail below.  GENETIC TEST RESULTS: At the time of Ms. Plata's visit on 04/29/15, we recommended she pursue genetic testing of the 20-gene Breast/Ovarian Cancer Panel through [facility].  The Breast/Ovarian Cancer Panel offered by [facility] [name], MD) includes sequencing and deletion/duplication analysis for the following 19 genes:  ATM, BARD1, BRCA1, BRCA2, BRIP1, CDH1, CHEK2, FANCC, MLH1, MSH2, MSH6, NBN, PALB2, PMS2, PTEN, RAD51C, RAD51D, TP53, and XRCC2.  This panel also includes deletion/duplication analysis (without sequencing) for one gene, EPCAM.  Those results are now back, the report date for which is May 10, 2015.  Genetic testing was normal, and did not reveal a deleterious mutation in these genes.  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  The test report will be scanned into EPIC and will be located under the Results Review tab in the Pathology>Molecular Pathology section.   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 At this time, it is unknown if this VUS is associated with an increased risk for cancer or if this is a normal finding. Since this VUS result is uncertain, it cannot  help guide screening recommendations, and family members should not be tested for this VUS to help define their own cancer risks.  Also, we all have variants within our genes that make [place] unique individuals--most of these variants are benign.  Thus, we treat this VUS as a negative result.   With time, we suspect the lab will reclassify this variant and when they do, we will try to re-contact Ms. Barra to discuss the reclassification further.  We also encouraged Ms. Crager to contact us in a year or two to obtain an update on the status of this VUS.  We discussed with Ms. Heck that since the current genetic testing is not perfect, it is possible there may be a gene mutation in one of these genes that current testing cannot detect, but that chance is small. We also discussed, that it is possible that another gene that has not yet been discovered, or that we have not yet tested, is responsible for the cancer diagnoses in the family, and it is, therefore, important to remain in touch with cancer genetics in the future so that we can continue to offer Ms. Shankman the most up to date genetic testing.   CANCER SCREENING RECOMMENDATIONS: This normal result is reassuring and indicates that Ms. Haecker does not likely have an increased risk of cancer due to a mutation in one of these genes.  We, therefore, recommended  Ms. Rizo continue to follow the cancer screening guidelines provided by her primary healthcare providers.  Two risk models quote Ms. Schwandt's lifetime risk for breast cancer at approximately 15-33%.  She should speak with her primary provider about whether or not additional breast screening (such as breast MRIs) is  indicated.    RECOMMENDATIONS FOR FAMILY MEMBERS: Women in this family might be at some increased risk of developing cancer, over the general population risk, simply due to the family history of cancer. We recommended women in this family have a yearly mammogram  beginning at age 32, or 75 years younger than the earliest onset of cancer, an an annual clinical breast exam, and perform monthly breast self-exams. Women in this family should also have a gynecological exam as recommended by their primary provider. All family members should have a colonoscopy by age 14.  Based on Ms. Oleson's family history, we recommended her brother also have genetic counseling and testing, since there may have been a genetic cause for Ms. Primo's mother's early-onset breast cancer that Ms. Stacey did not herself inherit. Ms. Ranker will let us know if we can be of any assistance in coordinating genetic counseling and/or testing for this family member.   FOLLOW-UP: Lastly, we discussed with Ms. Lienhard that cancer genetics is a rapidly advancing field and it is possible that new genetic tests will be appropriate for her and/or her family members in the future. We encouraged her to remain in contact with cancer genetics on an annual basis so we can update her personal and family histories and let her know of advances in cancer genetics that may benefit this family.   Our contact number was provided. Ms. Trnka's questions were answered to her satisfaction, and she knows she is welcome to call us at anytime with additional questions or concerns.   Jeanine Luz, MS, [facility] Certified Genetic Counselor [email or web address] Phone: [phone]

## 2015-05-28 ENCOUNTER — Ambulatory Visit: Payer: Medicaid Other | Admitting: Obstetrics

## 2015-05-28 ENCOUNTER — Ambulatory Visit (INDEPENDENT_AMBULATORY_CARE_PROVIDER_SITE_OTHER): Payer: Medicaid Other | Admitting: Obstetrics

## 2015-05-28 DIAGNOSIS — N840 Polyp of corpus uteri: Secondary | ICD-10-CM | POA: Diagnosis not present

## 2015-05-28 DIAGNOSIS — N939 Abnormal uterine and vaginal bleeding, unspecified: Secondary | ICD-10-CM | POA: Diagnosis not present

## 2015-05-28 DIAGNOSIS — R102 Pelvic and perineal pain unspecified side: Secondary | ICD-10-CM

## 2015-05-28 DIAGNOSIS — N9489 Other specified conditions associated with female genital organs and menstrual cycle: Secondary | ICD-10-CM

## 2015-05-28 LAB — POCT URINALYSIS DIPSTICK
Bilirubin, UA: NEGATIVE
Glucose, UA: NEGATIVE
KETONES UA: NEGATIVE
LEUKOCYTES UA: NEGATIVE
NITRITE UA: NEGATIVE
PH UA: 5
PROTEIN UA: NEGATIVE
Spec Grav, UA: 1.01
Urobilinogen, UA: NEGATIVE

## 2015-05-29 ENCOUNTER — Encounter: Payer: Self-pay | Admitting: Obstetrics

## 2015-05-29 ENCOUNTER — Telehealth: Payer: Self-pay | Admitting: *Deleted

## 2015-05-29 DIAGNOSIS — N390 Urinary tract infection, site not specified: Secondary | ICD-10-CM

## 2015-05-29 MED ORDER — NITROFURANTOIN MONOHYD MACRO 100 MG PO CAPS: 100.0000 mg | ORAL_CAPSULE | Freq: Two times a day (BID) | ORAL | Status: DC

## 2015-05-29 NOTE — Telephone Encounter (Signed)
Patient states she has UTI symptoms- burning with urination. She request treatment. 3:07 OK to treat per provider.

## 2015-05-29 NOTE — Progress Notes (Signed)
Patient ID: Crystal Edwards, female   DOB: 1975/07/27, 40 y.o.   MRN: [id number]  Chief Complaint  Patient presents with  . Follow-up    HPI Crystal Edwards is a 40 y.o. female.  H/O heavy and painful periods.  Pelvic ultrasound done.  Patient presents for results.  HPI  Past Medical History  Diagnosis Date  . HPV test positive   . Anemia   . Hypertension     Past Surgical History  Procedure Laterality Date  . Leep    . Cesarean section      Family History  Problem Relation Age of Onset  . Hypertension Mother   . Breast cancer Mother 65  . Lung cancer Mother 69    smoker  . Fibroids Mother     +TAH  . Cancer Other   . Hypertension Other   . Diabetes Other   . Breast cancer Maternal Aunt 70  . Prostate cancer Maternal Uncle 58  . Colon cancer Maternal Grandmother 45  . Breast cancer Maternal Grandmother     dx. 82-85  . Lung cancer Paternal Grandmother     d. 42; smoker    Social History Social History  Substance Use Topics  . Smoking status: Never Smoker   . Smokeless tobacco: Never Used  . Alcohol Use: No    No Known Allergies  Current Outpatient Prescriptions  Medication Sig Dispense Refill  . albuterol (PROVENTIL HFA;VENTOLIN HFA) 108 (90 Base) MCG/ACT inhaler Inhale 2 puffs into the lungs every 4 (four) hours as needed for wheezing or shortness of breath. 1 Inhaler 0  . cyclobenzaprine (FLEXERIL) 5 MG tablet Take 5 mg by mouth 3 (three) times daily as needed for muscle spasms.    . ferrous sulfate 325 (65 FE) MG tablet Take 1 tablet (325 mg total) by mouth 3 (three) times daily with meals. 90 tablet 0  . lisinopril-hydrochlorothiazide (PRINZIDE,ZESTORETIC) 10-12.5 MG tablet Take 1 tablet by mouth daily.  5  . naproxen sodium (ANAPROX) 220 MG tablet Take 440 mg by mouth 2 (two) times daily as needed (headahce).    . Omega-3 Fatty Acids (FISH OIL) 1000 MG CAPS Take by mouth.    . Prenatal Vit-Fe Phos-FA-Omega (VITAFOL GUMMIES) 3.33-0.333-34.8 MG  CHEW Chew 3 tablets by mouth daily. 90 tablet 12  . Sinecatechins (VEREGEN) 15 % OINT Apply 1 application topically 2 (two) times daily. 30 g 1  . terconazole (TERAZOL 7) 0.4 % vaginal cream Place 1 applicator vaginally at bedtime. 45 g 0  . tranexamic acid (LYSTEDA) 650 MG TABS tablet Take 2 tablets (1,300 mg total) by mouth 3 (three) times daily. For the first 5 days of your period. 30 tablet 1   No current facility-administered medications for this visit.    Review of Systems Review of Systems Constitutional: negative for fatigue and weight loss Respiratory: negative for cough and wheezing Cardiovascular: negative for chest pain, fatigue and palpitations Gastrointestinal: negative for abdominal pain and change in bowel habits Genitourinary:negative Integument/breast: negative for nipple discharge Musculoskeletal:negative for myalgias Neurological: negative for gait problems and tremors Behavioral/Psych: negative for abusive relationship, depression Endocrine: negative for temperature intolerance     There were no vitals taken for this visit.  Physical Exam Physical Exam:  Deferred  100% of 10 min visit spent on counseling and coordination of care.   Data Reviewed Ultrasound  Assessment     AUB Large endometrial mass      Plan    Referred to [facility]  for further evaluation  Orders Placed This Encounter  Procedures  . POCT urinalysis dipstick   No orders of the defined types were placed in this encounter.

## 2015-06-05 ENCOUNTER — Telehealth: Payer: Self-pay | Admitting: *Deleted

## 2015-06-05 NOTE — Telephone Encounter (Signed)
Not hematology GYN oncology- for large polyp- see visit note.

## 2015-06-05 NOTE — Telephone Encounter (Signed)
Patient wants to talk about a referral to hematology. Does she need it? Please review her labs and call her.

## 2015-06-11 ENCOUNTER — Ambulatory Visit (INDEPENDENT_AMBULATORY_CARE_PROVIDER_SITE_OTHER): Payer: Medicaid Other | Admitting: Obstetrics

## 2015-06-11 DIAGNOSIS — N9489 Other specified conditions associated with female genital organs and menstrual cycle: Secondary | ICD-10-CM

## 2015-06-11 NOTE — Progress Notes (Signed)
Patient ID: Crystal Edwards, female   DOB: 05-09-1975, 40 y.o.   MRN: [id number]  Chief Complaint  Patient presents with  . Advice Only    Patient has been awaiting a referral to [facility] for fibroids. She has called several times and the referral has not been scheduled.    HPI Crystal Edwards is a 40 y.o. female.  No complaints   HPI  Past Medical History  Diagnosis Date  . HPV test positive   . Anemia   . Hypertension     Past Surgical History  Procedure Laterality Date  . Leep    . Cesarean section      Family History  Problem Relation Age of Onset  . Hypertension Mother   . Breast cancer Mother 91  . Lung cancer Mother 90    smoker  . Fibroids Mother     +TAH  . Cancer Other   . Hypertension Other   . Diabetes Other   . Breast cancer Maternal Aunt 70  . Prostate cancer Maternal Uncle 58  . Colon cancer Maternal Grandmother 96  . Breast cancer Maternal Grandmother     dx. 82-85  . Lung cancer Paternal Grandmother     d. 67; smoker    Social History Social History  Substance Use Topics  . Smoking status: Never Smoker   . Smokeless tobacco: Never Used  . Alcohol Use: No    No Known Allergies  Current Outpatient Prescriptions  Medication Sig Dispense Refill  . albuterol (PROVENTIL HFA;VENTOLIN HFA) 108 (90 Base) MCG/ACT inhaler Inhale 2 puffs into the lungs every 4 (four) hours as needed for wheezing or shortness of breath. 1 Inhaler 0  . cyclobenzaprine (FLEXERIL) 5 MG tablet Take 5 mg by mouth 3 (three) times daily as needed for muscle spasms.    . ferrous sulfate 325 (65 FE) MG tablet Take 1 tablet (325 mg total) by mouth 3 (three) times daily with meals. 90 tablet 0  . lisinopril-hydrochlorothiazide (PRINZIDE,ZESTORETIC) 10-12.5 MG tablet Take 1 tablet by mouth daily.  5  . naproxen sodium (ANAPROX) 220 MG tablet Take 440 mg by mouth 2 (two) times daily as needed (headahce).    . nitrofurantoin, macrocrystal-monohydrate, (MACROBID) 100 MG  capsule Take 1 capsule (100 mg total) by mouth 2 (two) times daily. 14 capsule 0  . Omega-3 Fatty Acids (FISH OIL) 1000 MG CAPS Take by mouth.    . Prenatal Vit-Fe Phos-FA-Omega (VITAFOL GUMMIES) 3.33-0.333-34.8 MG CHEW Chew 3 tablets by mouth daily. 90 tablet 12  . Sinecatechins (VEREGEN) 15 % OINT Apply 1 application topically 2 (two) times daily. 30 g 1  . terconazole (TERAZOL 7) 0.4 % vaginal cream Place 1 applicator vaginally at bedtime. 45 g 0  . tranexamic acid (LYSTEDA) 650 MG TABS tablet Take 2 tablets (1,300 mg total) by mouth 3 (three) times daily. For the first 5 days of your period. 30 tablet 1   No current facility-administered medications for this visit.    Review of Systems Review of Systems Constitutional: negative for fatigue and weight loss Respiratory: negative for cough and wheezing Cardiovascular: negative for chest pain, fatigue and palpitations Gastrointestinal: negative for abdominal pain and change in bowel habits Genitourinary:negative Integument/breast: negative for nipple discharge Musculoskeletal:negative for myalgias Neurological: negative for gait problems and tremors Behavioral/Psych: negative for abusive relationship, depression Endocrine: negative for temperature intolerance     There were no vitals taken for this visit.  Physical Exam Physical Exam:  Deferred  100%  of 10 min visit spent on counseling and coordination of care.   Data Reviewed Ultrasound  Assessment     Large endometrial mass    Plan    Refer to Gyn Oncology for evaluation   No orders of the defined types were placed in this encounter.   No orders of the defined types were placed in this encounter.

## 2015-06-13 ENCOUNTER — Encounter: Payer: Self-pay | Admitting: Gynecologic Oncology

## 2015-06-13 NOTE — Progress Notes (Signed)
Recommendation for D&C based on ultrasound results per Dr. Fermin Schwab.  Dr. Jodi Mourning notified of recommendations.

## 2015-06-14 ENCOUNTER — Encounter: Payer: Self-pay | Admitting: Obstetrics

## 2015-06-16 ENCOUNTER — Telehealth: Payer: Self-pay | Admitting: *Deleted

## 2015-06-16 NOTE — Telephone Encounter (Signed)
Patient is calling about her referral- do you need to speak with her?

## 2015-06-18 ENCOUNTER — Other Ambulatory Visit: Payer: Self-pay | Admitting: Obstetrics

## 2015-06-19 ENCOUNTER — Telehealth: Payer: Self-pay | Admitting: Gynecologic Oncology

## 2015-06-19 NOTE — Telephone Encounter (Signed)
Message left for patient.  She had called our office yesterday asking about a new patient referral.  Advised her that Dr. Fermin Schwab had reviewed her ultrasound and recommended a D&C performed by Dr. Jodi Mourning.  Advised patient that I would reach out to Dr Jodi Mourning.  Spoke with Dr. Jodi Mourning, who is going to reach out to the patient with additional info.  06/19/15  15:22 Left message for the patient today informing her that she should be hearing from Dr. Jacelyn Grip office.

## 2015-06-20 ENCOUNTER — Other Ambulatory Visit: Payer: Self-pay | Admitting: Obstetrics

## 2015-06-20 ENCOUNTER — Other Ambulatory Visit: Payer: Self-pay | Admitting: *Deleted

## 2015-06-20 DIAGNOSIS — D509 Iron deficiency anemia, unspecified: Secondary | ICD-10-CM

## 2015-06-20 MED ORDER — POLYSACCHARIDE IRON COMPLEX 150 MG PO CAPS: 150.0000 mg | ORAL_CAPSULE | Freq: Every day | ORAL | Status: AC

## 2015-06-20 NOTE — Progress Notes (Signed)
Pt called to office regarding Rx for iron.  Return call to pt.  Pt states that Dr Jodi Mourning had given her several samples of iron supplement. Pt would now like a Rx for iron. Reviewed with Dr Jodi Mourning, approved to sent in Niferex. Rx has been sent, pt aware. Pt advised to call if any problems in coverage.

## 2015-06-23 ENCOUNTER — Other Ambulatory Visit (HOSPITAL_COMMUNITY): Payer: Medicaid Other

## 2015-06-23 ENCOUNTER — Other Ambulatory Visit: Payer: Self-pay | Admitting: *Deleted

## 2015-06-24 ENCOUNTER — Other Ambulatory Visit: Payer: Self-pay | Admitting: *Deleted

## 2015-07-18 NOTE — Patient Instructions (Signed)
Your procedure is scheduled on:  Tomorrow, July 22, 2015  Enter through the Main Entrance of [facility] at: 6:00 AM  Pick up the phone at the desk and dial [phone].  Call this number if you have problems the morning of surgery: [phone].  Remember: Do NOT eat food or drink after:  Midnight Tonight  Take these medicines the morning of surgery with a SIP OF WATER: Lisinopril  Bring Asthma Inhaler day of surgery  Stop taking fish oil at this time  Do NOT wear jewelry (body piercing), metal hair clips/bobby pins, make-up, or nail polish. Do NOT wear lotions, powders, or perfumes.  You may wear deodorant. Do NOT shave for 48 hours prior to surgery. Do NOT bring valuables to the hospital. Contacts, dentures, or bridgework may not be worn into surgery.  Have a responsible adult drive you home and stay with you for 24 hours after your procedure

## 2015-07-21 ENCOUNTER — Encounter (HOSPITAL_COMMUNITY): Payer: Self-pay | Admitting: Anesthesiology

## 2015-07-21 ENCOUNTER — Inpatient Hospital Stay (HOSPITAL_COMMUNITY)
Admission: RE | Admit: 2015-07-21 | Discharge: 2015-07-21 | Disposition: A | Discharge status: Home / Self Care | Payer: Medicaid Other | Source: Ambulatory Visit

## 2015-07-21 NOTE — Anesthesia Preprocedure Evaluation (Deleted)
Anesthesia Evaluation  Patient identified by MRN, date of birth, ID band Patient awake    Reviewed: Allergy & Precautions, NPO status , Patient's Chart, lab work & pertinent test results  Airway Mallampati: II  TM Distance: >3 FB Neck ROM: Full    Dental no notable dental hx.    Pulmonary asthma ,    Pulmonary exam normal breath sounds clear to auscultation       Cardiovascular hypertension, Pt. on medications Normal cardiovascular exam Rhythm:Regular Rate:Normal     Neuro/Psych  Headaches, PSYCHIATRIC DISORDERS    GI/Hepatic negative GI ROS, Neg liver ROS,   Endo/Other  negative endocrine ROS  Renal/GU negative Renal ROS     Musculoskeletal negative musculoskeletal ROS (+)   Abdominal   Peds negative pediatric ROS (+)  Hematology negative hematology ROS (+) anemia ,   Anesthesia Other Findings   Reproductive/Obstetrics negative OB ROS                             Anesthesia Physical Anesthesia Plan  ASA: II  Anesthesia Plan: General   Post-op Pain Management:    Induction: Intravenous  Airway Management Planned: LMA  Additional Equipment:   Intra-op Plan:   Post-operative Plan: Extubation in OR  Informed Consent: I have reviewed the patients History and Physical, chart, labs and discussed the procedure including the risks, benefits and alternatives for the proposed anesthesia with the patient or authorized representative who has indicated his/her understanding and acceptance.   Dental advisory given  Plan Discussed with: CRNA  Anesthesia Plan Comments:         Anesthesia Quick Evaluation

## 2015-07-22 ENCOUNTER — Encounter (HOSPITAL_COMMUNITY): Admission: RE | Payer: Self-pay | Source: Ambulatory Visit

## 2015-07-22 ENCOUNTER — Ambulatory Visit (HOSPITAL_COMMUNITY): Admission: RE | Admit: 2015-07-22 | Payer: Medicaid Other | Source: Ambulatory Visit | Admitting: Obstetrics

## 2015-07-22 SURGERY — DILATATION & CURETTAGE/HYSTEROSCOPY WITH MYOSURE
Anesthesia: Choice

## 2015-11-25 ENCOUNTER — Emergency Department (HOSPITAL_COMMUNITY)
Admission: EM | Admit: 2015-11-25 | Discharge: 2015-11-26 | Disposition: A | Discharge status: Home / Self Care | Payer: Medicaid Other | Attending: Emergency Medicine | Admitting: Emergency Medicine

## 2015-11-25 ENCOUNTER — Encounter (HOSPITAL_COMMUNITY): Payer: Self-pay | Admitting: Emergency Medicine

## 2015-11-25 DIAGNOSIS — N920 Excessive and frequent menstruation with regular cycle: Secondary | ICD-10-CM | POA: Diagnosis not present

## 2015-11-25 DIAGNOSIS — I10 Essential (primary) hypertension: Secondary | ICD-10-CM | POA: Diagnosis not present

## 2015-11-25 DIAGNOSIS — Z79899 Other long term (current) drug therapy: Secondary | ICD-10-CM | POA: Diagnosis not present

## 2015-11-25 DIAGNOSIS — D500 Iron deficiency anemia secondary to blood loss (chronic): Secondary | ICD-10-CM

## 2015-11-25 DIAGNOSIS — R42 Dizziness and giddiness: Secondary | ICD-10-CM | POA: Diagnosis present

## 2015-11-25 LAB — BASIC METABOLIC PANEL
ANION GAP: 8 (ref 5–15)
BUN: 11 mg/dL (ref 6–20)
CALCIUM: 9.3 mg/dL (ref 8.9–10.3)
CO2: 27 mmol/L (ref 22–32)
CREATININE: 0.67 mg/dL (ref 0.44–1.00)
Chloride: 106 mmol/L (ref 101–111)
GFR calc Af Amer: >60 mL/min (ref >60)
GFR calc non Af Amer: >60 mL/min (ref >60)
GLUCOSE: 130 mg/dL — AB (ref 65–99)
Potassium: 3.2 mmol/L — ABNORMAL LOW (ref 3.5–5.1)
Sodium: 141 mmol/L (ref 135–145)

## 2015-11-25 LAB — URINALYSIS, ROUTINE W REFLEX MICROSCOPIC
BILIRUBIN URINE: NEGATIVE
GLUCOSE, UA: NEGATIVE mg/dL
HGB URINE DIPSTICK: NEGATIVE
Ketones, ur: NEGATIVE mg/dL
Leukocytes, UA: NEGATIVE
Nitrite: NEGATIVE
Protein, ur: NEGATIVE mg/dL
SPECIFIC GRAVITY, URINE: 1.028 (ref 1.005–1.030)
pH: 6.5 (ref 5.0–8.0)

## 2015-11-25 LAB — CBC
HCT: 28 % — ABNORMAL LOW (ref 36.0–46.0)
HEMOGLOBIN: 8.8 g/dL — AB (ref 12.0–15.0)
MCH: 24 pg — AB (ref 26.0–34.0)
MCHC: 31.4 g/dL (ref 30.0–36.0)
MCV: 76.5 fL — AB (ref 78.0–100.0)
Platelets: 334 10*3/uL (ref 150–400)
RBC: 3.66 MIL/uL — ABNORMAL LOW (ref 3.87–5.11)
RDW: 16.4 % — ABNORMAL HIGH (ref 11.5–15.5)
WBC: 5.5 10*3/uL (ref 4.0–10.5)

## 2015-11-25 LAB — RETICULOCYTES
RBC.: 3.52 MIL/uL — ABNORMAL LOW (ref 3.87–5.11)
RETIC CT PCT: 1.4 % (ref 0.4–3.1)
Retic Count, Absolute: 49.3 10*3/uL (ref 19.0–186.0)

## 2015-11-25 LAB — CBG MONITORING, ED: GLUCOSE-CAPILLARY: 91 mg/dL (ref 65–99)

## 2015-11-25 MED ORDER — POTASSIUM CHLORIDE CRYS ER 20 MEQ PO TBCR
40.0000 meq | EXTENDED_RELEASE_TABLET | Freq: Once | ORAL | Status: AC
  Administered 2015-11-25: 40 meq via ORAL
  Filled 2015-11-25: qty 2

## 2015-11-25 NOTE — ED Provider Notes (Signed)
[facility] DEPT Provider Note   CSN: [id number] Arrival date & time: 11/25/15  2103 By signing my name below, I, Doran Stabler, attest that this documentation has been prepared under the direction and in the presence of No att. providers found. Electronically Signed: Doran Stabler, ED Scribe. 11/26/15. 11:19 PM.  Time seen 23:14 PM  History    The history is provided by the patient. No language interpreter was used.   HPI Comments: Crystal Edwards is a 40 y.o. female who presents to the Emergency Department complaining of dizziness that began this morning. Pt also reports fatigue, exertional SOB, intermittent spontaneous CP, vaginal bleeding, and lower shooting abdominal pain. Pt states she became dizzy while she was walking her dog. She describes her dizziness as if she was going to "pass out." Pt would stop for a bit before she resumed walking her dog. She has been having nonexertional chest pain off and on. She states each episode of chest pain would last a few minutes. Pt reports her vaginal bleeding has been heavy with associated clots for the past 1 year. She sees Dr. Delsa Sale, OB/GYN who has scheduled a surgery to remove a polyp in her uterus. She was to have surgery as outpatient last month, but didn't have someone to drive her home and was rescheduled to be done in January. In addition, the pt states she has fibroids. Pt denies any diaphoresis, nausea, fevers, chills, N/V/D or any other symptoms at this time. LNMP November 07, 2015. She states her periods have increased from 3 days to 10 days and she is still having spotting from her last menses in October. She has been on oral iron for a long time.    PCP [facility] GYN Dr Lahoma Crocker  Past Medical History:  Diagnosis Date  . Anemia   . HPV test positive   . Hypertension     Patient Active Problem List   Diagnosis Date Noted  . Genetic testing 05/26/2015  . Family history of breast cancer in  female 04/29/2015  . Family history of colon cancer 04/29/2015  . Abnormal uterine bleeding (AUB) 04/01/2015  . 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 Hypertension 03/15/2014  . Iron deficiency anemia 06/19/2007  . SICKLE CELL TRAIT 06/19/2007  . PANIC ATTACK 06/19/2007  . MIGRAINE HEADACHE 06/19/2007  . ASTHMA 06/19/2007  . INSOMNIA 06/19/2007  . DEPRESSION, HX OF 06/19/2007    Past Surgical History:  Procedure Laterality Date  . CESAREAN SECTION    . LEEP      OB History    Gravida Para Term Preterm AB Living   4 1 1   3 1    SAB TAB Ectopic Multiple Live Births   1       1       Home Medications    Prior to Admission medications   Medication Sig Start Date End Date Taking? Authorizing Provider  albuterol (PROVENTIL HFA;VENTOLIN HFA) 108 (90 Base) MCG/ACT inhaler Inhale 2 puffs into the lungs every 4 (four) hours as needed for wheezing or shortness of breath. 01/21/15  Yes Janne Napoleon, NP  lisinopril-hydrochlorothiazide (PRINZIDE,ZESTORETIC) 10-12.5 MG tablet Take 1 tablet by mouth daily. 02/25/15  Yes Historical Provider, MD  Omega-3 Fatty Acids (FISH OIL) 1000 MG CAPS Take 1 capsule by mouth daily.    Yes Historical Provider, MD  Prenatal Vit-Fe Phos-FA-Omega (VITAFOL GUMMIES) 3.33-0.333-34.8 MG CHEW Chew 3 tablets by mouth daily. 04/01/15  Yes Morene Crocker, CNM  ferrous sulfate 325 (65 FE) MG tablet Take 1 tablet (325 mg total) by mouth 3 (three) times daily with meals. Patient not taking: Reported on 06/21/2015 03/11/15   Orpah Greek, MD  iron polysaccharides (NIFEREX) 150 MG capsule Take 1 capsule (150 mg total) by mouth daily. Patient not taking: Reported on 11/25/2015 06/20/15   Shelly Bombard, MD  nitrofurantoin, macrocrystal-monohydrate, (MACROBID) 100 MG capsule Take 1 capsule (100 mg total) by mouth 2 (two) times daily. Patient not taking: Reported on 06/21/2015 05/29/15   Shelly Bombard, MD  Sinecatechins (VEREGEN) 15 % OINT  Apply 1 application topically 2 (two) times daily. Patient not taking: Reported on 11/25/2015 04/01/15   Rachelle A Denney, CNM  terconazole (TERAZOL 7) 0.4 % vaginal cream Place 1 applicator vaginally at bedtime. Patient not taking: Reported on 06/21/2015 04/01/15   Morene Crocker, CNM  tranexamic acid (LYSTEDA) 650 MG TABS tablet Take 2 tablets (1,300 mg total) by mouth 3 (three) times daily. For the first 5 days of your period. Patient not taking: Reported on 11/25/2015 04/22/15   Morene Crocker, CNM    Family History Family History  Problem Relation Age of Onset  . Hypertension Mother   . Breast cancer Mother 67  . Lung cancer Mother 107    smoker  . Fibroids Mother     +TAH  . Breast cancer Maternal Aunt 70  . Prostate cancer Maternal Uncle 58  . Colon cancer Maternal Grandmother 74  . Breast cancer Maternal Grandmother     dx. 82-85  . Lung cancer Paternal Grandmother     d. 57; smoker  . Cancer Other   . Hypertension Other   . Diabetes Other     Social History Social History  Substance Use Topics  . Smoking status: Never Smoker  . Smokeless tobacco: Never Used  . Alcohol use No  . Pt is a Garment/textile technologist.   Allergies   Patient has no known allergies.   Review of Systems Review of Systems  Constitutional: Positive for fatigue. Negative for chills and fever.  Respiratory: Positive for shortness of breath.   Cardiovascular: Positive for chest pain.  Gastrointestinal: Positive for abdominal pain. Negative for nausea.  Genitourinary: Positive for vaginal bleeding.  Neurological: Positive for dizziness.  All other systems reviewed and are negative.   Physical Exam Updated Vital Signs BP 126/77 (BP Location: Left Arm)   Pulse 85   Temp 97.7 F (36.5 C) (Oral)   Resp 16   Ht 5\' 8"  (1.727 m)   Wt 190 lb (86.2 kg)   LMP 11/07/2015 (Exact Date)   SpO2 100%   BMI 28.89 kg/m   Vital signs normal    Physical Exam  Constitutional: She is oriented  to person, place, and time. She appears well-developed and well-nourished.  Non-toxic appearance. She does not appear ill. No distress.  HENT:  Head: Normocephalic and atraumatic.  Right Ear: External ear normal.  Left Ear: External ear normal.  Nose: Nose normal. No mucosal edema or rhinorrhea.  Mouth/Throat: Oropharynx is clear and moist and mucous membranes are normal. No dental abscesses or uvula swelling.  Eyes: EOM are normal. Pupils are equal, round, and reactive to light.  conjunctiva is pale.   Neck: Normal range of motion and full passive range of motion without pain. Neck supple.  Cardiovascular: Normal rate, regular rhythm and normal heart sounds.  Exam reveals no gallop and no friction rub.   No murmur heard. Pulmonary/Chest:  Effort normal and breath sounds normal. No respiratory distress. She has no wheezes. She has no rhonchi. She has no rales. She exhibits no tenderness and no crepitus.  Abdominal: Soft. Normal appearance and bowel sounds are normal. She exhibits no distension. There is no tenderness. There is no rebound and no guarding.  Musculoskeletal: Normal range of motion. She exhibits no edema or tenderness.  Moves all extremities well.   Neurological: She is alert and oriented to person, place, and time. She has normal strength. No cranial nerve deficit.  Skin: Skin is warm, dry and intact. No rash noted. No erythema. There is pallor.  Palms and nail beds are pale  Psychiatric: She has a normal mood and affect. Her speech is normal and behavior is normal. Her mood appears not anxious.  Nursing note and vitals reviewed.  ED Treatments / Results  DIAGNOSTIC STUDIES: .    Labs (all labs ordered are listed, but only abnormal results are displayed) Results for orders placed or performed during the hospital encounter of [id number]  Basic metabolic panel  Result Value Ref Range   Sodium 141 135 - 145 mmol/L   Potassium 3.2 (L) 3.5 - 5.1 mmol/L   Chloride 106 101 - 111  mmol/L   CO2 27 22 - 32 mmol/L   Glucose, Bld 130 (H) 65 - 99 mg/dL   BUN 11 6 - 20 mg/dL   Creatinine, Ser 0.67 0.44 - 1.00 mg/dL   Calcium 9.3 8.9 - 10.3 mg/dL   GFR calc non Af Amer >60 >60 mL/min   GFR calc Af Amer >60 >60 mL/min   Anion gap 8 5 - 15  CBC  Result Value Ref Range   WBC 5.5 4.0 - 10.5 K/uL   RBC 3.66 (L) 3.87 - 5.11 MIL/uL   Hemoglobin 8.8 (L) 12.0 - 15.0 g/dL   HCT 28.0 (L) 36.0 - 46.0 %   MCV 76.5 (L) 78.0 - 100.0 fL   MCH 24.0 (L) 26.0 - 34.0 pg   MCHC 31.4 30.0 - 36.0 g/dL   RDW 16.4 (H) 11.5 - 15.5 %   Platelets 334 150 - 400 K/uL  Urinalysis, Routine w reflex microscopic  Result Value Ref Range   Color, Urine YELLOW YELLOW   APPearance CLOUDY (A) CLEAR   Specific Gravity, Urine 1.028 1.005 - 1.030   pH 6.5 5.0 - 8.0   Glucose, UA NEGATIVE NEGATIVE mg/dL   Hgb urine dipstick NEGATIVE NEGATIVE   Bilirubin Urine NEGATIVE NEGATIVE   Ketones, ur NEGATIVE NEGATIVE mg/dL   Protein, ur NEGATIVE NEGATIVE mg/dL   Nitrite NEGATIVE NEGATIVE   Leukocytes, UA NEGATIVE NEGATIVE  Reticulocytes  Result Value Ref Range   Retic Ct Pct 1.4 0.4 - 3.1 %   RBC. 3.52 (L) 3.87 - 5.11 MIL/uL   Retic Count, Manual 49.3 19.0 - 186.0 K/uL  CBG monitoring, ED  Result Value Ref Range   Glucose-Capillary 91 65 - 99 mg/dL   Laboratory interpretation all normal except Hypokalemia and anemia (she has had a hemoglobin down to 9.1 in the past).    Procedures Procedures (including critical care time)  Medications Ordered in ED Medications  potassium chloride SA (K-DUR,KLOR-CON) CR tablet 40 mEq (40 mEq Oral Given 11/25/15 2350)     Initial Impression / Assessment and Plan / ED Course  I have reviewed the triage vital signs and the nursing notes.  Pertinent labs & imaging results that were available during my care of the patient were reviewed by me  and considered in my medical decision making (see chart for details).  Clinical Course   COORDINATION OF CARE: 11:19 PM  Discussed her CBC results and treatment plan with pt at bedside and pt agreed to plan. Anemia panel was added to her lab work.   She was given oral potassium for a low potassium.   Orthostatic VS for the past 24 hrs:  BP- Lying Pulse- Lying BP- Sitting Pulse- Sitting BP- Standing at 0 minutes Pulse- Standing at 0 minutes  11/25/15 2347 - - - - 113/78 78  11/25/15 2345 - - 118/73 74 - -  11/25/15 2342 115/74 73 - - - -  orthostatic VS were negative  Patient does have an anemia however her orthostatic vital signs were done were normal. They are incomplete in my chart, however she did not become hypotensive or tachycardic on standing. At this point I do not feel like she needs a transfusion in the ED. She probably needs some type of more intensive outpatient iron therapy such as IV iron.  01:42 AM Discussed with Dr Okey Dupre, [facility] GYN, have call the office in the morning to discuss her anemia and if she needs IV iron.   2:38 AM Discussed with pt that her physicians at [facility] want her to make a f/u appointment to see them.   Final Clinical Impressions(s) / ED Diagnoses   Final diagnoses:  Menorrhagia with regular cycle  Iron deficiency anemia due to chronic blood loss   Plan discharge  Rolland Porter, MD, FACEP   I personally performed the services described in this documentation, which was scribed in my presence. The recorded information has been reviewed and considered.  Rolland Porter, MD, Barbette Or, MD 11/26/15 [phone]

## 2015-11-25 NOTE — ED Triage Notes (Signed)
PT states she thinks her hemoglobin is low; c/o dizziness and being cold; pt states her menstrual cycle has been "off" and sometimes has spotting; states her last menstrual cycle was very heavy and had "plum-sized clots"; ambulatory in triage

## 2015-11-26 LAB — FOLATE: FOLATE: 31.2 ng/mL (ref >5.9)

## 2015-11-26 LAB — IRON AND TIBC
Iron: 23 ug/dL — ABNORMAL LOW (ref 28–170)
SATURATION RATIOS: 6 % — AB (ref 10.4–31.8)
TIBC: 382 ug/dL (ref 250–450)
UIBC: 359 ug/dL

## 2015-11-26 LAB — FERRITIN: FERRITIN: 5 ng/mL — AB (ref 11–307)

## 2015-11-26 LAB — VITAMIN B12: Vitamin B-12: 466 pg/mL (ref 180–914)

## 2015-11-26 NOTE — Discharge Instructions (Signed)
Please call Dr Anson Crofts office this morning to let her know about your ED visit. Your hemoglobin is 8.8.  I sent an anemia panel that she can check in Care Everywhere. You may need to get IV iron is your iron level is still low and you are taking your oral iron pills.  But, this is something she will need to decide.

## 2015-11-26 NOTE — ED Notes (Signed)
Pt ambulatory and independent at discharge.  Verbalized understanding of discharge instructions 

## 2016-02-10 IMAGING — DX DG CHEST 2V
2 series · 2 of 2 positions shown · non-contrast
Comparison: PA and lateral chest x-ray May 20, 2014

CLINICAL DATA: Cough, low grade fever, chest congestion and mild
sore throat for the past week, history of asthma and hypertension ;
nonsmoker.

EXAM:
CHEST  2 VIEW

[chest pa]
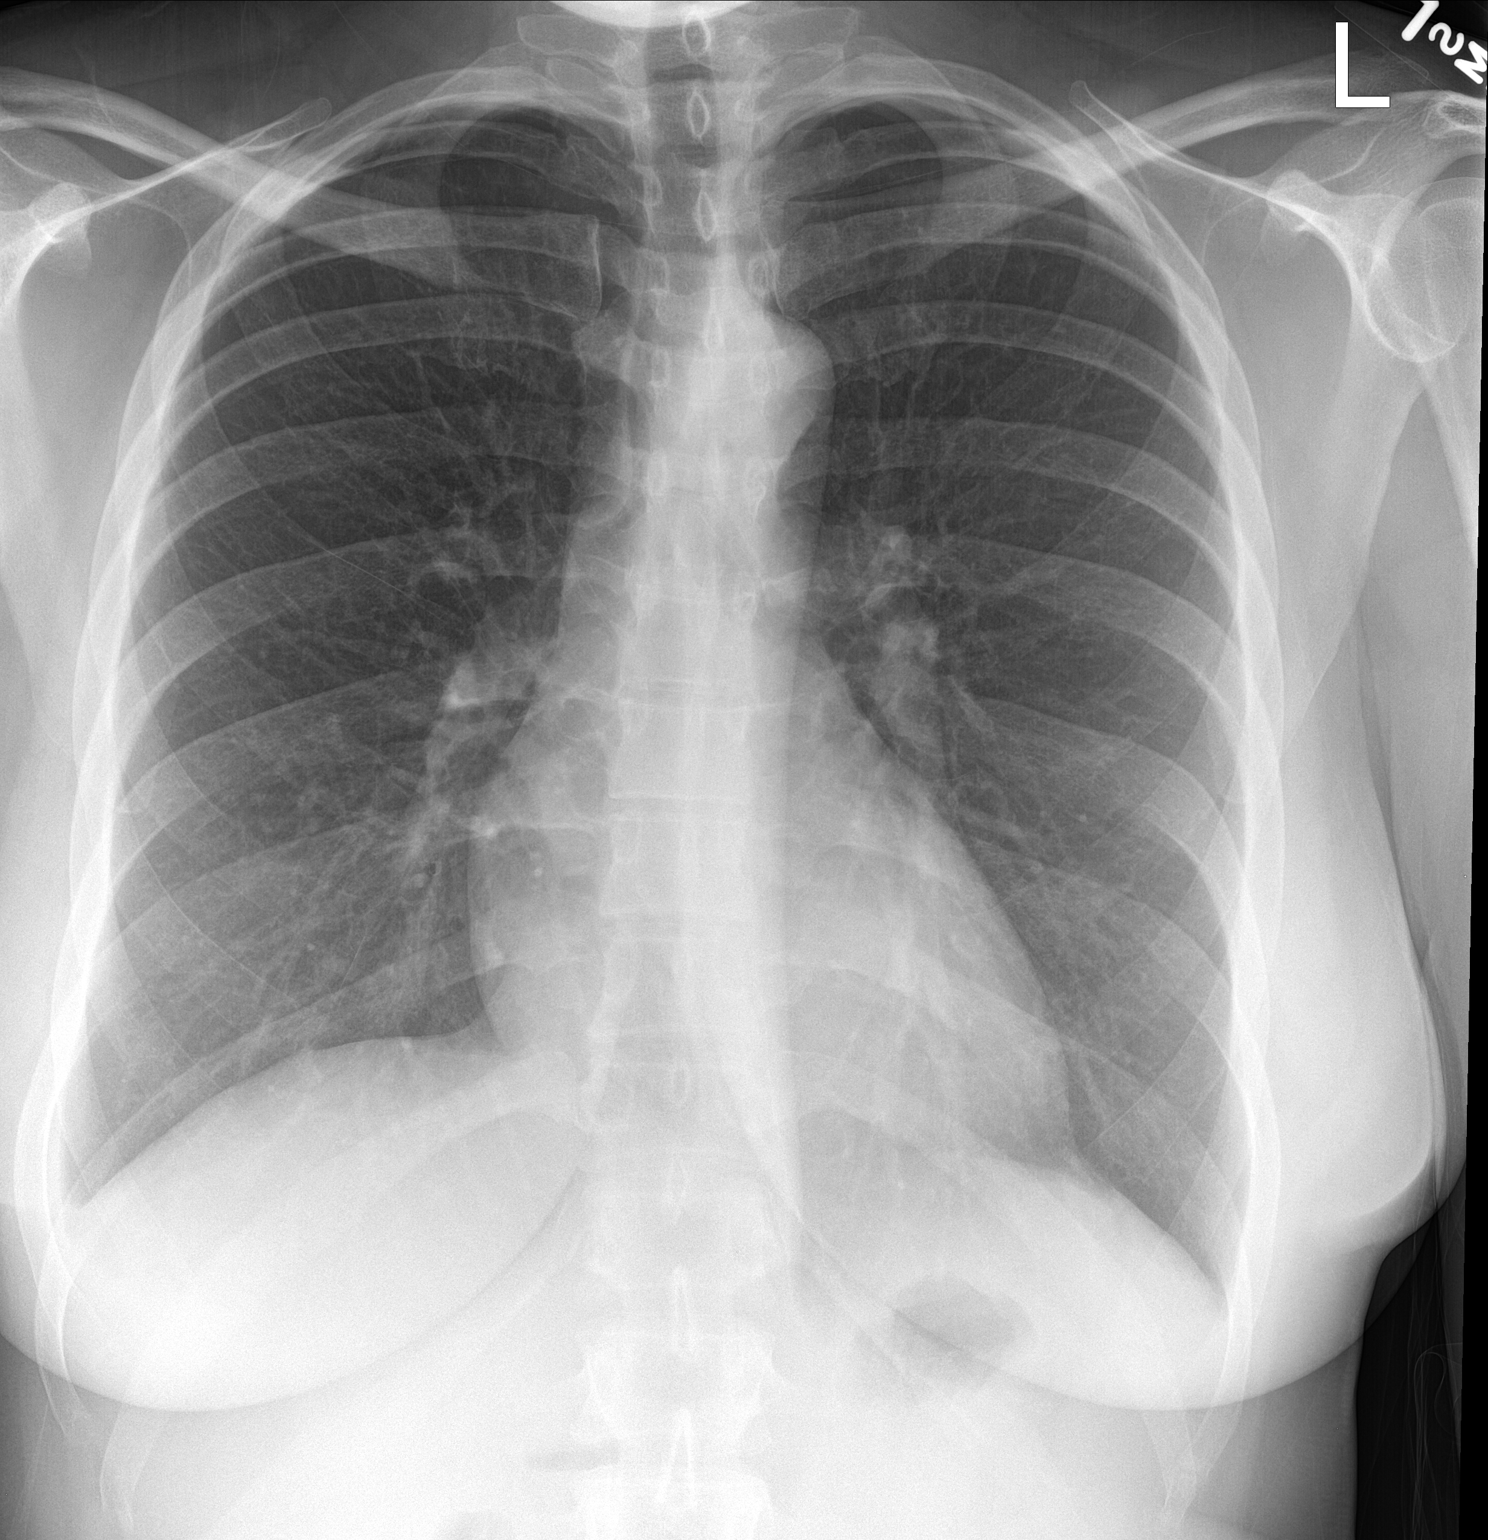

[chest lat]
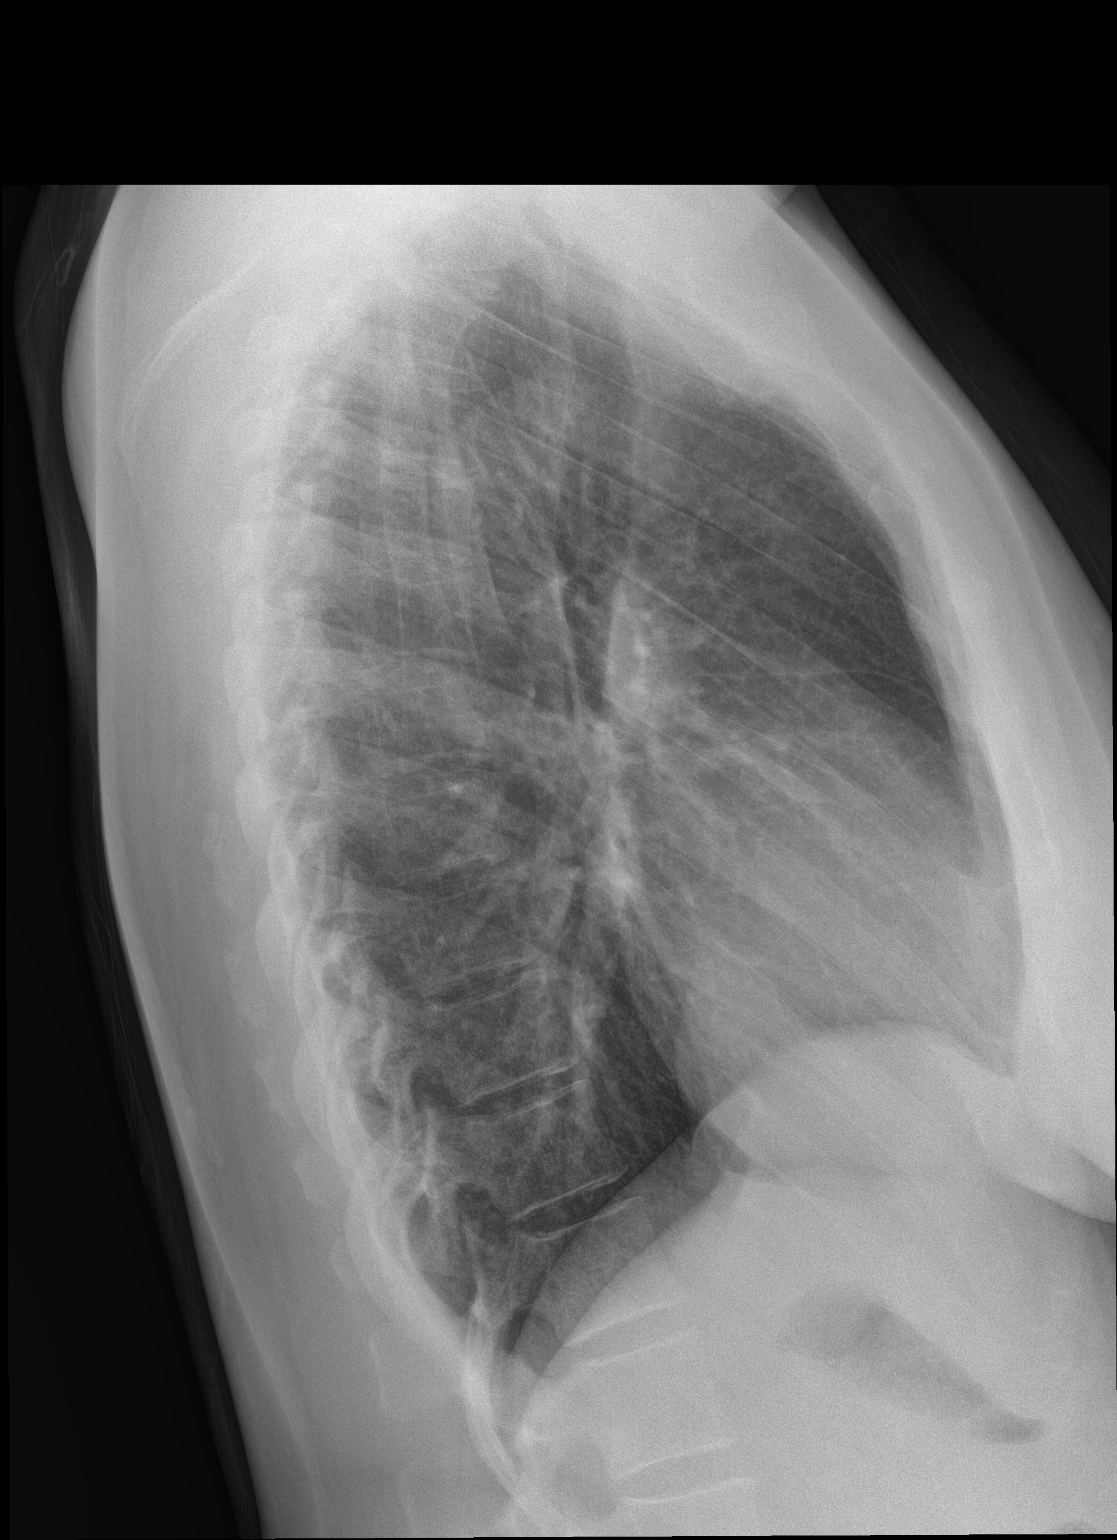

[2 of 2 positions shown; findings below may reference images not displayed]

FINDINGS: The lungs are mildly hyperinflated without hemidiaphragm flattening.
There is no interstitial or alveolar infiltrate. The heart and
pulmonary vascularity are normal. The mediastinum is normal in
width. There is no pleural effusion. The bony thorax exhibits no
acute abnormality.
IMPRESSION: Mild hyperinflation may be voluntary or may reflect acute bronchitic
change. There is no pneumonia, CHF, nor other acute cardiopulmonary
disease.

## 2016-04-10 ENCOUNTER — Other Ambulatory Visit: Payer: Self-pay | Admitting: Certified Nurse Midwife

## 2016-04-10 DIAGNOSIS — Z862 Personal history of diseases of the blood and blood-forming organs and certain disorders involving the immune mechanism: Secondary | ICD-10-CM

## 2016-08-17 ENCOUNTER — Encounter (HOSPITAL_COMMUNITY): Payer: Self-pay

## 2017-01-28 ENCOUNTER — Other Ambulatory Visit: Payer: Self-pay

## 2017-01-28 ENCOUNTER — Emergency Department (HOSPITAL_COMMUNITY)
Admission: EM | Admit: 2017-01-28 | Discharge: 2017-01-28 | Disposition: A | Discharge status: Home / Self Care | Payer: Medicaid Other | Attending: Emergency Medicine | Admitting: Emergency Medicine

## 2017-01-28 ENCOUNTER — Emergency Department (HOSPITAL_COMMUNITY): Payer: Medicaid Other

## 2017-01-28 ENCOUNTER — Encounter (HOSPITAL_COMMUNITY): Payer: Self-pay | Admitting: Emergency Medicine

## 2017-01-28 DIAGNOSIS — R0789 Other chest pain: Secondary | ICD-10-CM | POA: Diagnosis present

## 2017-01-28 DIAGNOSIS — I10 Essential (primary) hypertension: Secondary | ICD-10-CM | POA: Diagnosis not present

## 2017-01-28 DIAGNOSIS — Z79899 Other long term (current) drug therapy: Secondary | ICD-10-CM | POA: Diagnosis not present

## 2017-01-28 DIAGNOSIS — J45909 Unspecified asthma, uncomplicated: Secondary | ICD-10-CM | POA: Insufficient documentation

## 2017-01-28 DIAGNOSIS — M940 Chondrocostal junction syndrome [Tietze]: Secondary | ICD-10-CM | POA: Insufficient documentation

## 2017-01-28 LAB — BASIC METABOLIC PANEL
Anion gap: 9 (ref 5–15)
BUN: 9 mg/dL (ref 6–20)
CHLORIDE: 104 mmol/L (ref 101–111)
CO2: 23 mmol/L (ref 22–32)
CREATININE: 0.74 mg/dL (ref 0.44–1.00)
Calcium: 9.1 mg/dL (ref 8.9–10.3)
GFR calc Af Amer: >60 mL/min (ref >60)
GFR calc non Af Amer: >60 mL/min (ref >60)
Glucose, Bld: 124 mg/dL — ABNORMAL HIGH (ref 65–99)
Potassium: 3.8 mmol/L (ref 3.5–5.1)
Sodium: 136 mmol/L (ref 135–145)

## 2017-01-28 LAB — CBC
HCT: 36.1 % (ref 36.0–46.0)
Hemoglobin: 11.8 g/dL — ABNORMAL LOW (ref 12.0–15.0)
MCH: 27.1 pg (ref 26.0–34.0)
MCHC: 32.7 g/dL (ref 30.0–36.0)
MCV: 83 fL (ref 78.0–100.0)
PLATELETS: 333 10*3/uL (ref 150–400)
RBC: 4.35 MIL/uL (ref 3.87–5.11)
RDW: 13.6 % (ref 11.5–15.5)
WBC: 4.5 10*3/uL (ref 4.0–10.5)

## 2017-01-28 LAB — I-STAT TROPONIN, ED: Troponin i, poc: 0 ng/mL (ref 0.00–0.08)

## 2017-01-28 LAB — I-STAT BETA HCG BLOOD, ED (MC, WL, AP ONLY)

## 2017-01-28 MED ORDER — IOPAMIDOL (ISOVUE-370) INJECTION 76%
INTRAVENOUS | Status: AC
  Administered 2017-01-28: 100 mL
  Filled 2017-01-28: qty 100

## 2017-01-28 NOTE — ED Provider Notes (Signed)
[facility] EMERGENCY DEPARTMENT Provider Note   CSN: [id number] Arrival date & time: 01/28/17  1048     History   Chief Complaint Chief Complaint  Patient presents with  . Chest Pain    HPI Crystal Edwards is a 42 y.o. female.  42 year old female with history of hypertension presents with complaint of anterior chest wall pain.  Patient reports that she has had intermittent pain to the anterior chest for the last 4 days.  Patient's pain is worse with heavy coughing.  She denies associated shortness of breath.  She denies fever.  She was seen by her primary care on Tuesday of this week.  At that point, she had a normal EKG and chest x-ray per report.     The history is provided by the patient.  Chest Pain   This is a new problem. The current episode started more than 2 days ago. The problem occurs rarely. The problem has been gradually improving. The pain is associated with exertion, movement and coughing. The pain is present in the substernal region. The patient is experiencing no pain. The quality of the pain is described as brief. The pain does not radiate. Duration of episode(s) is 10 seconds. The symptoms are aggravated by certain positions. She has tried nothing for the symptoms. The treatment provided no relief. There are no known risk factors.    Past Medical History:  Diagnosis Date  . Anemia   . HPV test positive   . Hypertension     Patient Active Problem List   Diagnosis Date Noted  . Genetic testing 05/26/2015  . Family history of breast cancer in female 04/29/2015  . Family history of colon cancer 04/29/2015  . Abnormal uterine bleeding (AUB) 04/01/2015  . 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 Hypertension 03/15/2014  . Iron deficiency anemia 06/19/2007  . SICKLE CELL TRAIT 06/19/2007  . PANIC ATTACK 06/19/2007  . MIGRAINE HEADACHE 06/19/2007  . ASTHMA 06/19/2007  . INSOMNIA 06/19/2007  . DEPRESSION, HX OF  06/19/2007    Past Surgical History:  Procedure Laterality Date  . CESAREAN SECTION    . LEEP      OB History    Gravida Para Term Preterm AB Living   4 1 1   3 1    SAB TAB Ectopic Multiple Live Births   1       1       Home Medications    Prior to Admission medications   Medication Sig Start Date End Date Taking? Authorizing Provider  albuterol (PROVENTIL HFA;VENTOLIN HFA) 108 (90 Base) MCG/ACT inhaler Inhale 2 puffs into the lungs every 4 (four) hours as needed for wheezing or shortness of breath. 01/21/15   Janne Napoleon, NP  ferrous sulfate 325 (65 FE) MG tablet Take 1 tablet (325 mg total) by mouth 3 (three) times daily with meals. Patient not taking: Reported on 06/21/2015 03/11/15   Orpah Greek, MD  iron polysaccharides (NIFEREX) 150 MG capsule Take 1 capsule (150 mg total) by mouth daily. Patient not taking: Reported on 11/25/2015 06/20/15   Shelly Bombard, MD  lisinopril-hydrochlorothiazide (PRINZIDE,ZESTORETIC) 10-12.5 MG tablet Take 1 tablet by mouth daily. 02/25/15   [provider]  nitrofurantoin, macrocrystal-monohydrate, (MACROBID) 100 MG capsule Take 1 capsule (100 mg total) by mouth 2 (two) times daily. Patient not taking: Reported on 06/21/2015 05/29/15   Shelly Bombard, MD  Omega-3 Fatty Acids (FISH OIL) 1000 MG CAPS Take 1  capsule by mouth daily.     [provider]  Prenatal Vit-Fe Phos-FA-Omega (VITAFOL GUMMIES) 3.33-0.333-34.8 [place] 04/11/16   Shelly Bombard, MD  Sinecatechins (VEREGEN) 15 % OINT Apply 1 application topically 2 (two) times daily. Patient not taking: Reported on 11/25/2015 04/01/15   Kandis Cocking A, CNM  terconazole (TERAZOL 7) 0.4 % vaginal cream Place 1 applicator vaginally at bedtime. Patient not taking: Reported on 06/21/2015 04/01/15   Kandis Cocking A, CNM  tranexamic acid (LYSTEDA) 650 MG TABS tablet Take 2 tablets (1,300 mg total) by mouth 3 (three) times daily.  For the first 5 days of your period. Patient not taking: Reported on 11/25/2015 04/22/15   Morene Crocker, CNM    Family History Family History  Problem Relation Age of Onset  . Hypertension Mother   . Breast cancer Mother 62  . Lung cancer Mother 67       smoker  . Fibroids Mother        +TAH  . Breast cancer Maternal Aunt 70  . Prostate cancer Maternal Uncle 58  . Colon cancer Maternal Grandmother 67  . Breast cancer Maternal Grandmother        dx. 82-85  . Lung cancer Paternal Grandmother        d. 71; smoker  . Cancer Other   . Hypertension Other   . Diabetes Other     Social History Social History   Tobacco Use  . Smoking status: Never Smoker  . Smokeless tobacco: Never Used  Substance Use Topics  . Alcohol use: No    Alcohol/week: 0.0 oz  . Drug use: No     Allergies   Patient has no known allergies.   Review of Systems Review of Systems  Cardiovascular: Positive for chest pain.  All other systems reviewed and are negative.    Physical Exam Updated Vital Signs BP 130/77 (BP Location: Left Arm)   Pulse 80   Temp (!) 97.5 F (36.4 C) (Oral)   Resp 16   LMP 01/16/2017 (Exact Date)   SpO2 100%   Physical Exam  Constitutional: She is oriented to person, place, and time. She appears well-developed and well-nourished. No distress.  HENT:  Head: Normocephalic and atraumatic.  Mouth/Throat: Oropharynx is clear and moist.  Eyes: Conjunctivae and EOM are normal. Pupils are equal, round, and reactive to light.  Neck: Normal range of motion. Neck supple.  Cardiovascular: Normal rate, regular rhythm and normal heart sounds.  Pulmonary/Chest: Effort normal and breath sounds normal. No respiratory distress. She exhibits tenderness.  Mild tenderness with palpation at the costochondral joints.  This reproduces the patient's reported pain.  Abdominal: Soft. She exhibits no distension. There is no tenderness.  Musculoskeletal: Normal range of motion. She  exhibits no edema or deformity.  Neurological: She is alert and oriented to person, place, and time.  Skin: Skin is warm and dry.  Psychiatric: She has a normal mood and affect.  Nursing note and vitals reviewed.    ED Treatments / Results  Labs (all labs ordered are listed, but only abnormal results are displayed) Labs Reviewed  BASIC METABOLIC PANEL - Abnormal; Notable for the following components:      Result Value   Glucose, Bld 124 (*)    All other components within normal limits  CBC - Abnormal; Notable for the following components:   Hemoglobin 11.8 (*)    All other components within normal limits  I-STAT TROPONIN, ED  I-STAT BETA HCG BLOOD, ED ([facility], [facility], [facility] ONLY)    EKG  EKG Interpretation  Date/Time:  Friday January 28 2017 10:50:55 EST Ventricular Rate:  84 PR Interval:  150 QRS Duration: 74 QT Interval:  372 QTC Calculation: 439 R Axis:   70 Text Interpretation:  Normal sinus rhythm Normal ECG Confirmed by Dene Gentry [phone]) on 01/28/2017 1:57:23 PM       Radiology Dg Chest 2 View  Result Date: 01/28/2017 CLINICAL DATA:  Chest pain, shortness of breath and productive cough for 3 days. EXAM: CHEST  2 VIEW COMPARISON:  PA and lateral chest 03/10/2015. FINDINGS: The lungs are clear. Heart size is normal. No pneumothorax or pleural effusion. No bony abnormality. IMPRESSION: Normal chest. Electronically Signed   By: Inge Rise M.D.   On: 01/28/2017 12:29    Procedures Procedures (including critical care time)  Medications Ordered in ED Medications - No data to display   Initial Impression / Assessment and Plan / ED Course  I have reviewed the triage vital signs and the nursing notes.  Pertinent labs & imaging results that were available during my care of the patient were reviewed by me and considered in my medical decision making (see chart for details).  Clinical Course as of Jan 28 1453  Fri Jan 28, 2017  1356 ED EKG within 10 minutes [PM]      Clinical Course User Index [PM] Valarie Merino, MD    MDM screen complete  Patient is presenting with pain typical for costochondritis.  She does not have symptoms suggestive of ACS.  Screening evaluation in the ED does not suggest this as well.  EKG is normal and troponin is negative.  Patient feels improved at time of discharge.  She desires discharge home.  Close follow-up is advised.  Strict return precautions given and understood.   1500 Patient was about to be discharged.  She has decided that she does want a CTA of her chest as suggested by her primary.  This is after she had initially declined such testing.  Will obtain CTA to rule out PE.   Final Clinical Impressions(s) / ED Diagnoses   Final diagnoses:  Costochondritis    ED Discharge Orders    None       Valarie Merino, MD 01/28/17 [phone]

## 2017-01-28 NOTE — ED Triage Notes (Signed)
Pt reports central chest pain with right rib pain since Tuesday. Saw pcp, had ekg and cxray, was told to come to ED for chest CT then but states she did not want to. Pt a/ox4, resp e/u, nad.

## 2019-04-21 ENCOUNTER — Encounter (HOSPITAL_COMMUNITY): Payer: Self-pay | Admitting: *Deleted

## 2019-04-21 ENCOUNTER — Other Ambulatory Visit: Payer: Self-pay

## 2019-04-21 ENCOUNTER — Emergency Department (HOSPITAL_COMMUNITY)
Admission: EM | Admit: 2019-04-21 | Discharge: 2019-04-22 | Disposition: A | Discharge status: Home / Self Care | Payer: Medicaid Other | Attending: Emergency Medicine | Admitting: Emergency Medicine

## 2019-04-21 DIAGNOSIS — I10 Essential (primary) hypertension: Secondary | ICD-10-CM | POA: Insufficient documentation

## 2019-04-21 DIAGNOSIS — R590 Localized enlarged lymph nodes: Secondary | ICD-10-CM

## 2019-04-21 DIAGNOSIS — Z79899 Other long term (current) drug therapy: Secondary | ICD-10-CM | POA: Insufficient documentation

## 2019-04-21 DIAGNOSIS — R2231 Localized swelling, mass and lump, right upper limb: Secondary | ICD-10-CM | POA: Diagnosis present

## 2019-04-21 DIAGNOSIS — J45909 Unspecified asthma, uncomplicated: Secondary | ICD-10-CM | POA: Insufficient documentation

## 2019-04-21 DIAGNOSIS — D573 Sickle-cell trait: Secondary | ICD-10-CM | POA: Diagnosis not present

## 2019-04-21 NOTE — ED Triage Notes (Signed)
Pt reports she noticed a lump in her right axilla area, she made an appointment to see her doctor. However she comes in tonight because she says the past couple of days she has had increased discomfort in her right arm, right hand and swelling in her right hand after she is walking for long periods.

## 2019-04-21 NOTE — ED Provider Notes (Signed)
[facility] DEPT Provider Note   CSN: [id number] Arrival date & time: 04/21/19  2046   Time seen 11:28 PM  History Chief Complaint  Patient presents with  . Hand Pain    Crystal Edwards is a 44 y.o. female.  HPI   Patient states she noted a lump in her right axilla about 1-1/2 weeks ago.  She denies pain.  She states before it happened she had some itching.  She states she thinks it moves around because sometimes it is easy to feel and other times it seems to be in a deeper location.  She denies any fever or drainage.  She states she uses Dove powder fresh deodorant.  She states 3 days ago while walking she noted her right hand would swell up.  She normally walks for about 40 minutes.  Patient is right-handed and states it is mildly uncomfortable.  She denies any numbness.  She states her whole hand swells and her fingers get very swollen.  She denies any recent infection or skin lesions on her hand.  She denies having any breast lumps.  Her last mammogram was September 30.  Patient states her mother had breast cancer and was a survivor for 17 years and then developed lung cancer.  She has expired.  She states her maternal grandmother developed lung cancer and has died at age 65 and a maternal aunt has had breast cancer.  She does not smoke however her mother did smoke in the house while she was growing up.  Patient is right-handed.  Patient states she was tested for the BRCA gene about 5 years ago and it was negative.  PCP [facility] Dr Delsa Sale  Past Medical History:  Diagnosis Date  . Anemia   . HPV test positive   . Hypertension     Patient Active Problem List   Diagnosis Date Noted  . Genetic testing 05/26/2015  . Family history of breast cancer in female 04/29/2015  . Family history of colon cancer 04/29/2015  . Abnormal uterine bleeding (AUB) 04/01/2015  . 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  Hypertension 03/15/2014  . Iron deficiency anemia 06/19/2007  . SICKLE CELL TRAIT 06/19/2007  . PANIC ATTACK 06/19/2007  . MIGRAINE HEADACHE 06/19/2007  . ASTHMA 06/19/2007  . INSOMNIA 06/19/2007  . DEPRESSION, HX OF 06/19/2007    Past Surgical History:  Procedure Laterality Date  . CESAREAN SECTION    . LEEP       OB History    Gravida  4   Para  1   Term  1   Preterm      AB  3   Living  1     SAB  1   TAB      Ectopic      Multiple      Live Births  1           Family History  Problem Relation Age of Onset  . Hypertension Mother   . Breast cancer Mother 42  . Lung cancer Mother 42       smoker  . Fibroids Mother        +TAH  . Breast cancer Maternal Aunt 70  . Prostate cancer Maternal Uncle 58  . Colon cancer Maternal Grandmother 67  . Breast cancer Maternal Grandmother        dx. 82-85  . Lung cancer Paternal Grandmother  d. 67; smoker  . Cancer Other   . Hypertension Other   . Diabetes Other     Social History   Tobacco Use  . Smoking status: Never Smoker  . Smokeless tobacco: Never Used  Substance Use Topics  . Alcohol use: No    Alcohol/week: 0.0 standard drinks  . Drug use: No    Home Medications Prior to Admission medications   Medication Sig Start Date End Date Taking? Authorizing Provider  lisinopril-hydrochlorothiazide (PRINZIDE,ZESTORETIC) 10-12.5 MG tablet Take 1 tablet by mouth daily. 02/25/15  Yes [provider]  albuterol (PROVENTIL HFA;VENTOLIN HFA) 108 (90 Base) MCG/ACT inhaler Inhale 2 puffs into the lungs every 4 (four) hours as needed for wheezing or shortness of breath. Patient not taking: Reported on 04/22/2019 01/21/15   Janne Napoleon, NP  ferrous sulfate 325 (65 FE) MG tablet Take 1 tablet (325 mg total) by mouth 3 (three) times daily with meals. Patient not taking: Reported on 06/21/2015 03/11/15   Orpah Greek, MD  iron polysaccharides (NIFEREX) 150 MG capsule Take 1 capsule (150 mg  total) by mouth daily. Patient not taking: Reported on 11/25/2015 06/20/15   Shelly Bombard, MD  nitrofurantoin, macrocrystal-monohydrate, (MACROBID) 100 MG capsule Take 1 capsule (100 mg total) by mouth 2 (two) times daily. Patient not taking: Reported on 06/21/2015 05/29/15   Shelly Bombard, MD  Prenatal Vit-Fe Phos-FA-Omega (VITAFOL GUMMIES) 3.33-0.333-34.8 [place] 3 [place] Patient not taking: Reported on 04/22/2019 04/11/16   Shelly Bombard, MD  Sinecatechins (VEREGEN) 15 % OINT Apply 1 application topically 2 (two) times daily. Patient not taking: Reported on 11/25/2015 04/01/15   Kandis Cocking A, CNM  terconazole (TERAZOL 7) 0.4 % vaginal cream Place 1 applicator vaginally at bedtime. Patient not taking: Reported on 06/21/2015 04/01/15   Kandis Cocking A, CNM  tranexamic acid (LYSTEDA) 650 MG TABS tablet Take 2 tablets (1,300 mg total) by mouth 3 (three) times daily. For the first 5 days of your period. Patient not taking: Reported on 11/25/2015 04/22/15   Kandis Cocking A, CNM    Allergies    Sulfa antibiotics  Review of Systems   Review of Systems  All other systems reviewed and are negative.   Physical Exam Updated Vital Signs BP (!) 141/98 (BP Location: Left Arm)   Pulse 69   Temp 98 F (36.7 C) (Oral)   Resp 18   Ht 5' 8"  (1.727 m)   Wt 90.7 kg   SpO2 100%   BMI 30.41 kg/m   Physical Exam Vitals and nursing note reviewed.  Constitutional:      Appearance: Normal appearance. She is normal weight.  HENT:     Head: Normocephalic.     Right Ear: External ear normal.     Left Ear: External ear normal.  Eyes:     Conjunctiva/sclera: Conjunctivae normal.     Pupils: Pupils are equal, round, and reactive to light.  Cardiovascular:     Rate and Rhythm: Normal rate.  Pulmonary:     Effort: Pulmonary effort is normal. No respiratory distress.  Musculoskeletal:        General: No swelling. Normal range of motion.     Cervical  back: Normal range of motion.     Comments: On exam of her upper extremities there is no obvious swelling now and she concurs.  When I examine her axilla there is no obvious swelling, redness of the skin or irregularity visually seen.  On palpation there is some fullness in the area where she feels like she has noticed the lump.  I do not feel a distinct lymph node.  Her breast exam is without obvious deformities or masses.  Skin:    General: Skin is warm and dry.     Findings: No erythema.  Neurological:     General: No focal deficit present.     Mental Status: She is alert and oriented to person, place, and time.     Cranial Nerves: No cranial nerve deficit.  Psychiatric:        Mood and Affect: Mood normal.        Behavior: Behavior normal.        Thought Content: Thought content normal.     ED Results / Procedures / Treatments   Labs (all labs ordered are listed, but only abnormal results are displayed) Labs Reviewed - No data to display  EKG None  Radiology DG Chest 2 View  Result Date: 04/22/2019 CLINICAL DATA:  Right arm discomfort. EXAM: CHEST - 2 VIEW COMPARISON:  January 28, 2017 FINDINGS: The heart size and mediastinal contours are within normal limits. Both lungs are clear. The visualized skeletal structures are unremarkable. IMPRESSION: No active cardiopulmonary disease. Electronically Signed   By: Virgina Norfolk M.D.   On: 04/22/2019 00:12    Procedures Procedures (including critical care time)  Medications Ordered in ED Medications - No data to display  ED Course  I have reviewed the triage vital signs and the nursing notes.  Pertinent labs & imaging results that were available during my care of the patient were reviewed by me and considered in my medical decision making (see chart for details).    MDM Rules/Calculators/A&P                      We discussed that more than likely she is having some axillary lymphadenopathy which may be pressing on her  lymph system causing swelling in her right hand when she is walking and probably holding her hand down.  She does not have any obvious breast masses.  Chest x-ray was done to make sure that there was nothing abnormal in her lungs.  We discussed follow-up with the surgeon to see if they can do a needle aspiration or if a biopsy is necessary.  She should return to the ED however she gets fever or pain which would make it seem more likely this was an abscess although at this point I suspect is going to be a lymph node.   Final Clinical Impression(s) / ED Diagnoses Final diagnoses:  Axillary lymphadenopathy    Rx / DC Orders ED Discharge Orders    None     Plan discharge  Rolland Porter, MD, Barbette Or, MD 04/22/19 [phone]

## 2019-04-22 ENCOUNTER — Emergency Department (HOSPITAL_COMMUNITY): Payer: Medicaid Other

## 2019-04-22 NOTE — Discharge Instructions (Addendum)
Your chest xray was normal. Please call Dr Marlowe Aschoff office to get an appointment to see her to evaluate the swelling in your axilla (arm pit). Also you can let Dr Anson Crofts office know about your ED visit tonight. Recheck sooner if you get a lot of pain, fever or chills.

## 2019-05-02 ENCOUNTER — Telehealth: Payer: Self-pay | Admitting: Hematology and Oncology

## 2019-05-02 NOTE — Telephone Encounter (Signed)
Received a new referral from Dr. Brantley Stage for Ms. Crystal Edwards to be seen in the high risk breast clinic. [name] has been cld and scheduled to see Dr. Lindi Adie on 5/11 at 1pm. Pt aware to arrive 15 minutes early.

## 2019-05-10 ENCOUNTER — Other Ambulatory Visit: Payer: Self-pay | Admitting: Surgery

## 2019-05-10 ENCOUNTER — Ambulatory Visit: Payer: Medicaid Other

## 2019-05-10 DIAGNOSIS — Z9189 Other specified personal risk factors, not elsewhere classified: Secondary | ICD-10-CM

## 2019-05-21 NOTE — Progress Notes (Signed)
[facility] CONSULT NOTE  Patient Care Team: System, Provider Not In as PCP - General  CHIEF COMPLAINTS/PURPOSE OF CONSULTATION:  Newly diagnosed high risk for breast cancer  HISTORY OF PRESENTING ILLNESS:  Crystal Edwards 44 y.o. female is here because of recent diagnosis of high risk for breast cancer. She was seen in the ED on 04/21/19 for a painful right axilla cyst and refereed to Dr. Brantley Stage. She has a family history of breast cancer in mother in her 49s and paternal aunt in her 67s. She presents to the clinic today to discuss surveillance options.     I reviewed her records extensively and collaborated the history with the patient.  MEDICAL HISTORY:  Past Medical History:  Diagnosis Date  . Anemia   . HPV test positive   . Hypertension     SURGICAL HISTORY: Past Surgical History:  Procedure Laterality Date  . CESAREAN SECTION    . LEEP      SOCIAL HISTORY: Social History   Socioeconomic History  . Marital status: Single    Spouse name: Not on file  . Number of children: Not on file  . Years of education: Not on file  . Highest education level: Not on file  Occupational History  . Not on file  Tobacco Use  . Smoking status: Never Smoker  . Smokeless tobacco: Never Used  Substance and Sexual Activity  . Alcohol use: No    Alcohol/week: 0.0 standard drinks  . Drug use: No  . Sexual activity: Never    Birth control/protection: Condom  Other Topics Concern  . Not on file  Social History Narrative  . Not on file   Social Determinants of Health   Financial Resource Strain:   . Difficulty of Paying Living Expenses:   Food Insecurity:   . Worried About Charity fundraiser in the Last Year:   . Arboriculturist in the Last Year:   Transportation Needs:   . Film/video editor (Medical):   Marland Kitchen Lack of Transportation (Non-Medical):   Physical Activity:   . Days of Exercise per Week:   . Minutes of Exercise per Session:   Stress:   .  Feeling of Stress :   Social Connections:   . Frequency of Communication with Friends and Family:   . Frequency of Social Gatherings with Friends and Family:   . Attends Religious Services:   . Active Member of Clubs or Organizations:   . Attends Archivist Meetings:   Marland Kitchen Marital Status:   Intimate Partner Violence:   . Fear of Current or Ex-Partner:   . Emotionally Abused:   Marland Kitchen Physically Abused:   . Sexually Abused:     FAMILY HISTORY: Family History  Problem Relation Age of Onset  . Hypertension Mother   . Breast cancer Mother 43  . Lung cancer Mother 43       smoker  . Fibroids Mother        +TAH  . Breast cancer Maternal Aunt 70  . Prostate cancer Maternal Uncle 58  . Colon cancer Maternal Grandmother 38  . Breast cancer Maternal Grandmother        dx. 82-85  . Lung cancer Paternal Grandmother        d. 81; smoker  . Cancer Other   . Hypertension Other   . Diabetes Other     ALLERGIES:  is allergic to sulfa antibiotics.  MEDICATIONS:  Current Outpatient Medications  Medication  Sig Dispense Refill  . iron polysaccharides (NIFEREX) 150 MG capsule Take 1 capsule (150 mg total) by mouth daily. (Patient not taking: Reported on 11/25/2015) 30 capsule 11  . lisinopril-hydrochlorothiazide (PRINZIDE,ZESTORETIC) 10-12.5 MG tablet Take 1 tablet by mouth daily.  5  . tamoxifen (NOLVADEX) 20 MG tablet Take 1 tablet (20 mg total) by mouth daily. 90 tablet 3   No current facility-administered medications for this visit.    REVIEW OF SYSTEMS:   Constitutional: Denies fevers, chills or abnormal night sweats Eyes: Denies blurriness of vision, double vision or watery eyes Ears, nose, mouth, throat, and face: Denies mucositis or sore throat Respiratory: Denies cough, dyspnea or wheezes Cardiovascular: Denies palpitation, chest discomfort or lower extremity swelling Gastrointestinal:  Denies nausea, heartburn or change in bowel habits Skin: Denies abnormal skin  rashes Lymphatics: Denies new lymphadenopathy or easy bruising Neurological:Denies numbness, tingling or new weaknesses Behavioral/Psych: Mood is stable, no new changes  Breast: Denies any palpable lumps or discharge All other systems were reviewed with the patient and are negative.  PHYSICAL EXAMINATION: ECOG PERFORMANCE STATUS: 0 - Asymptomatic  Vitals:   05/22/19 1316  BP: (!) 154/83  Pulse: 82  Resp: 20  Temp: 98.5 F (36.9 C)  SpO2: 100%   Filed Weights   05/22/19 1316  Weight: 207 lb 4.8 oz (94 kg)    GENERAL:alert, no distress and comfortable SKIN: skin color, texture, turgor are normal, no rashes or significant lesions EYES: normal, conjunctiva are pink and non-injected, sclera clear OROPHARYNX:no exudate, no erythema and lips, buccal mucosa, and tongue normal  NECK: supple, thyroid normal size, non-tender, without nodularity LYMPH:  no palpable lymphadenopathy in the cervical, axillary or inguinal LUNGS: clear to auscultation and percussion with normal breathing effort HEART: regular rate & rhythm and no murmurs and no lower extremity edema ABDOMEN:abdomen soft, non-tender and normal bowel sounds Musculoskeletal:no cyanosis of digits and no clubbing  PSYCH: alert & oriented x 3 with fluent speech NEURO: no focal motor/sensory deficits BREAST: No palpable nodules in breast. No palpable axillary or supraclavicular lymphadenopathy (exam performed in the presence of a chaperone)   LABORATORY DATA:  I have reviewed the data as listed Lab Results  Component Value Date   WBC 4.5 01/28/2017   HGB 11.8 (L) 01/28/2017   HCT 36.1 01/28/2017   MCV 83.0 01/28/2017   PLT 333 01/28/2017   Lab Results  Component Value Date   NA 136 01/28/2017   K 3.8 01/28/2017   CL 104 01/28/2017   CO2 23 01/28/2017    RADIOGRAPHIC STUDIES: I have personally reviewed the radiological reports and agreed with the findings in the report.  ASSESSMENT AND PLAN:  At high risk for  breast cancer Patient with extensive family history of breast cancers mother and grandmother. She presented to the emergency room complaining of enlarged axillary lymph nodes.  She was referred to Dr. Brantley Stage to diagnosed her with the but appears to be cysts.  These appear to have improved.  She is scheduled for a breast MRI soon.  Because of her extensive family history she is very anxious about her prognosis and she was referred to [place] for further evaluation.   Risk assessment: Tyer Cusick 10-year risk 5.3% lifetime risk 27.9% Risk reduction: I recommended starting treatment with tamoxifen started 10 mg daily and then increase it to 20 mg if she tolerates it.  Tamoxifen counseling: We discussed the risks and benefits of tamoxifen. These include but not limited to insomnia, hot flashes, mood changes,  vaginal dryness, and weight gain. Although rare, serious side effects including endometrial cancer, risk of blood clots were also discussed. We strongly believe that the benefits far outweigh the risks. Patient understands these risks and consented to starting treatment. Planned treatment duration is 5 years.  Surveillance: 1.  Annual mammograms (usually done September 2020) 2.  Annual MRIs 6 months apart (next MRI will be done in March 2022)  Return to clinic in 3 months for tamoxifen toxicity check.   All questions were answered. The patient knows to call the clinic with any problems, questions or concerns.   Rulon Eisenmenger, MD, MPH 05/22/2019    I, Molly Dorshimer, am acting as scribe for Nicholas Lose, MD.  I have reviewed the above documentation for accuracy and completeness, and I agree with the above.

## 2019-05-22 ENCOUNTER — Other Ambulatory Visit: Payer: Self-pay

## 2019-05-22 ENCOUNTER — Inpatient Hospital Stay: Payer: Medicaid Other | Attending: Hematology and Oncology | Admitting: Hematology and Oncology

## 2019-05-22 DIAGNOSIS — R590 Localized enlarged lymph nodes: Secondary | ICD-10-CM | POA: Diagnosis not present

## 2019-05-22 DIAGNOSIS — Z9189 Other specified personal risk factors, not elsewhere classified: Secondary | ICD-10-CM | POA: Diagnosis not present

## 2019-05-22 DIAGNOSIS — Z803 Family history of malignant neoplasm of breast: Secondary | ICD-10-CM | POA: Insufficient documentation

## 2019-05-22 MED ORDER — TAMOXIFEN CITRATE 20 MG PO TABS: 20.0000 mg | ORAL_TABLET | Freq: Every day | ORAL | 3 refills | Status: AC

## 2019-05-22 NOTE — Assessment & Plan Note (Signed)
Patient with extensive family history of breast cancers mother and grandmother. She presented to the emergency room complaining of enlarged axillary lymph nodes.  She was referred to Dr. Brantley Stage to diagnosed her with the but appears to be cysts.  These appear to have improved.  She is scheduled for a breast MRI soon.  Because of her extensive family history she is very anxious about her prognosis and she was referred to [place] for further evaluation.   Risk assessment: Tyer Cusick 10-year risk 5.3% lifetime risk 27.9% Risk reduction: I recommended starting treatment with tamoxifen started 10 mg daily and then increase it to 20 mg if she tolerates it.  Tamoxifen counseling: We discussed the risks and benefits of tamoxifen. These include but not limited to insomnia, hot flashes, mood changes, vaginal dryness, and weight gain. Although rare, serious side effects including endometrial cancer, risk of blood clots were also discussed. We strongly believe that the benefits far outweigh the risks. Patient understands these risks and consented to starting treatment. Planned treatment duration is 5 years.  Surveillance: 1.  Annual mammograms (usually done September 2020) 2.  Annual MRIs 6 months apart (next MRI will be done in March 2022)  Return to clinic in 3 months for tamoxifen toxicity check.

## 2019-05-23 ENCOUNTER — Ambulatory Visit
Admission: RE | Admit: 2019-05-23 | Discharge: 2019-05-23 | Disposition: A | Discharge status: Home / Self Care | Payer: Medicaid Other | Source: Ambulatory Visit | Attending: Surgery | Admitting: Surgery

## 2019-05-23 DIAGNOSIS — Z9189 Other specified personal risk factors, not elsewhere classified: Secondary | ICD-10-CM

## 2019-05-23 MED ORDER — GADOBUTROL 1 MMOL/ML IV SOLN
10.0000 mL | Freq: Once | INTRAVENOUS | Status: AC | PRN
  Administered 2019-05-23: 12:00:00 10 mL via INTRAVENOUS

## 2019-05-30 ENCOUNTER — Telehealth: Payer: Self-pay | Admitting: Hematology and Oncology

## 2019-05-30 NOTE — Telephone Encounter (Signed)
Scheduled per los, patient has been called and voicemail was left. 

## 2019-06-12 ENCOUNTER — Telehealth: Payer: Self-pay | Admitting: Hematology and Oncology

## 2019-06-12 NOTE — Telephone Encounter (Signed)
Scheduled per 05/11 los, patient has been called and voicemail was left.

## 2019-08-22 ENCOUNTER — Inpatient Hospital Stay: Payer: Self-pay | Admitting: Hematology and Oncology

## 2019-09-11 ENCOUNTER — Encounter: Payer: Self-pay | Admitting: Occupational Therapy

## 2019-09-11 ENCOUNTER — Ambulatory Visit: Payer: Medicaid Other | Attending: Family Medicine | Admitting: Occupational Therapy

## 2019-09-11 ENCOUNTER — Other Ambulatory Visit: Payer: Self-pay

## 2019-09-11 DIAGNOSIS — M79642 Pain in left hand: Secondary | ICD-10-CM | POA: Diagnosis present

## 2019-09-11 DIAGNOSIS — M25642 Stiffness of left hand, not elsewhere classified: Secondary | ICD-10-CM | POA: Insufficient documentation

## 2019-09-11 DIAGNOSIS — M6281 Muscle weakness (generalized): Secondary | ICD-10-CM | POA: Diagnosis present

## 2019-09-11 NOTE — Patient Instructions (Addendum)
Your Splint This splint should initially be fitted by a healthcare practitioner.  The healthcare practitioner is responsible for providing wearing instructions and precautions to the patient, other healthcare practitioners and care provider involved in the patient's care.  This splint was custom made for you. Please read the following instructions to learn about wearing and caring for your splint.  Precautions Should your splint cause any of the following problems, remove the splint immediately and contact your therapist/physician.  Swelling  Severe Pain  Pressure Areas  Stiffness  Numbness  Do not wear your splint while operating machinery unless it has been fabricated for that purpose.  When To Wear Your Splint Where your splint according to your therapist/physician instructions. All the time except for cleaning hand finger tip must remain in extension while cleaning hand.  Care and Cleaning of Your Splint 1. Keep your splint away from open flames. 2. Your splint will lose its shape in temperatures over 135 degrees Farenheit, ( in car windows, near radiators, ovens or in hot water).  Never make any adjustments to your splint, if the splint needs adjusting remove it and make an appointment to see your therapist. 3. Your splint, including the cushion liner may be cleaned with soap and lukewarm water.  Do not immerse in hot water over 135 degrees Farenheit.   While you are wearing your splint bend the middle and knuckle joint o your pinky finger 10 reps 3x day, do not bend the tip joint.

## 2019-09-11 NOTE — Therapy (Addendum)
[facility] [address] [place], [place], [address] Phone: [phone]   Fax:  [phone]  Occupational Therapy Evaluation  Patient Details  Name: Crystal Edwards MRN: [id number] Date of Birth: 28-Oct-1975 Referring Provider (OT): Dr. Rip Harbour   Encounter Date: 09/11/2019   OT End of Session - 09/11/19 0736    Visit Number 1    Number of Visits 8    Date for OT Re-Evaluation 11/10/19    Authorization Type Medicaid    Authorization Time Period await auth    OT Start Time 0726    OT Stop Time 0800    OT Time Calculation (min) 34 min    Activity Tolerance Patient tolerated treatment well    Behavior During Therapy [facility] for tasks assessed/performed           Past Medical History:  Diagnosis Date  . Anemia   . HPV test positive   . Hypertension     Past Surgical History:  Procedure Laterality Date  . CESAREAN SECTION    . LEEP      There were no vitals filed for this visit.   Subjective Assessment - 09/11/19 1307    Subjective  Pt reports injuring finger in June    Patient Stated Goals custom splint, then to regain use of hand for sewing    Currently in Pain? Yes    Pain Score 2     Pain Location Finger (Comment which one)    Pain Orientation Left    Pain Descriptors / Indicators Aching    Pain Type Chronic pain    Pain Onset More than a month ago    Pain Frequency Intermittent    Aggravating Factors  pressure on fingertip    Pain Relieving Factors repositioning             [facility] OT Assessment - 09/11/19 0920      Assessment   Medical Diagnosis mallet finger     Referring Provider (OT) Dr. Rip Harbour    Onset Date/Surgical Date 07/09/19      Precautions   Precautions Other (comment)    Required Braces or Orthoses Other Brace/Splint   finger splint at all times, keep DIP joint straight     ADL   ADL comments Pt is currently using primarily RUE for ADLS, modified I with ADLs.       Coordination   Fine Motor Movements are Fluid and Coordinated No      ROM / Strength   AROM / PROM / Strength AROM      AROM   Overall AROM  Deficits;Unable to assess;Due to precautions    Overall AROM Comments 5 th digit DIP joint ROM not tested due to precautions      Hand Function   Left Hand Grip (lbs) not tested due to precautions                    OT Treatments/Exercises (OP) - 09/11/19 0001      Splinting   Splinting Pt arrived wearing a pre-fab protective finger splint Pt's splint was removed while maintaining DIP joint in extension. Pt was fitted with a custom volar DIP extension splint with PIP joint free. Pt's splint was applied with coban wrap. Pt was instructed in splint wear, care and precautions and to maintain 5 th digit in extension.                   OT Short Term  Goals - 09/11/19 1128      OT SHORT TERM GOAL #1   Title I with splint wear, care and precautions following 1-2 weeks to ensure proper fit    Baseline dependent splint issued at inital visit    Time 4    Period Weeks    Status New      OT SHORT TERM GOAL #2   Title I with A/ROM to PIP and MP joints for left small finger    Baseline dependent    Time 4    Period Weeks    Status New             OT Long Term Goals - 09/11/19 1130      OT LONG TERM GOAL #1   Title I with updated HEP.    Baseline dependent    Time 8    Period Weeks    Status New    Target Date 11/10/19      OT LONG TERM GOAL #2   Title Pt will demonstrate 5th digit  DIP extensor lag of -5 or less for increased functional use of LUE.    Baseline not tested due to precautions.    Time 8    Period Weeks    Status New      OT LONG TERM GOAL #3   Title Pt will demonstrate DIP flexion WFLS for ADLs/ IADLs and no reports of pain.    Baseline not tested due to precautions    Time 8    Period Weeks    Status New      OT LONG TERM GOAL #4   Title Pt will demonstrate LUE grip strength of at least 25  lbs for increased LUE functional use.    Baseline not tested due to precautions    Time 8    Period Weeks    Status New                 Plan - 09/11/19 1228    Clinical Impression Statement Pt is a 44 y.o female with diagnosis of mallet finger. Pt orginally injured her finger in June. Pt reports she has been wearing a pre-fab extension splint, however she is unsure how long she has been wearing. Pt can benefit from skilled occupational therapy to address the following deficits: decreased ROM, pain, decreased strength, decreased LUE functional use . Pt can benefit from skilled occupational therapy in order to address these deficits in order to maximize pt's safety and I with daily activities.    OT Occupational Profile and History Problem Focused Assessment - Including review of records relating to presenting problem    Occupational performance deficits (Please refer to evaluation for details): ADL's;IADL's;Work;Social Participation    Body Structure / Function / Physical Skills ADL;UE functional use;Flexibility;Pain;FMC;ROM;Gait;GMC;Decreased knowledge of use of DME;IADL;Strength;Improper spinal/pelvic alignment;Dexterity;Mobility;Coordination    Rehab Potential Fair    Clinical Decision Making Limited treatment options, no task modification necessary    Comorbidities Affecting Occupational Performance: None    Modification or Assistance to Complete Evaluation  No modification of tasks or assist necessary to complete eval    OT Frequency 1x / week   1x week x 3 weeks followed by 1x week x 4 week plus eval   OT Duration 8 weeks    OT Treatment/Interventions Self-care/ADL training;Ultrasound;Energy conservation;Therapeutic activities;Neuromuscular education;Therapeutic exercise;Moist Heat;Electrical Stimulation;Functional Mobility Training;Splinting;Fluidtherapy;Paraffin;Passive range of motion;Patient/family education;DME and/or AE instruction;Visual/perceptual remediation/compensation     Plan splint check and modifications prn, pt will  need to wear splint until MD d/c(pt is unsure as to how long she has been wearing splint) pt to make follow up appt with MD)    Consulted and Agree with Plan of Care Patient;Family member/caregiver           Patient will benefit from skilled therapeutic intervention in order to improve the following deficits and impairments:   Body Structure / Function / Physical Skills: ADL, UE functional use, Flexibility, Pain, FMC, ROM, Gait, GMC, Decreased knowledge of use of DME, IADL, Strength, Improper spinal/pelvic alignment, Dexterity, Mobility, Coordination     Check all possible CPT codes:      [x]  97110 (Therapeutic Exercise)  []  92507 (SLP Treatment)  []  97112 (Neuro Re-ed)   []  92526 (Swallowing Treatment)   []  60737 (Gait Training)   []  [id number] (Cognitive Training, 1st 15 minutes) [x]  97140 (Manual Therapy)   []  97130 (Cognitive Training, each add'l 15 minutes)  []  97530 (Therapeutic Activities)  [x]  Other, List CPT Code ___Fluidotherapy 97022_________    [x]  10626 (Self Care)       []  All codes above (97110 - 97535)  []  97012 (Mechanical Traction)  []  97014 (E-stim Unattended)  []  97032 (E-stim manual)  []  97033 (Ionto)  [x]  97035 (Ultrasound)  []  97016 (Vaso)  [x]  97760 (Orthotic Fit) []  [id number] (Prosthetic Training) []  L6539673 (Physical Performance Training) []  H7904499 (Aquatic Therapy) []  [id number] (Canalith Repositioning) []  [id number] (Contrast Bath) []  [id number] (Paraffin) []  97597 (Wound Care 1st 20 sq cm) []  97598 (Wound Care each add'l 20 sq cm)     Visit Diagnosis: Stiffness of left hand, not elsewhere classified  Pain in left hand  Muscle weakness (generalized)    Problem List Patient Active Problem List   Diagnosis Date Noted  . At high risk for breast cancer 05/22/2019  . Genetic testing 05/26/2015  . Family history of breast cancer in female 04/29/2015  . Family history of colon cancer 04/29/2015  . Abnormal uterine  bleeding (AUB) 04/01/2015  . 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 Hypertension 03/15/2014  . Iron deficiency anemia 06/19/2007  . SICKLE CELL TRAIT 06/19/2007  . PANIC ATTACK 06/19/2007  . MIGRAINE HEADACHE 06/19/2007  . ASTHMA 06/19/2007  . INSOMNIA 06/19/2007  . DEPRESSION, HX OF 06/19/2007    Desman Polak 09/11/2019, 1:10 PM Theone Murdoch, OTR/L [phone] Phone: [phone] 9:01 AM 09/12/19 [facility] [address] [place], [place], [address] Phone: [phone]   Fax:  [phone]  Name: DELCIA SPITZLEY MRN: [id number] Date of Birth: Dec 22, 1975

## 2019-09-20 ENCOUNTER — Other Ambulatory Visit: Payer: Self-pay

## 2019-09-20 ENCOUNTER — Ambulatory Visit: Payer: Medicaid Other | Attending: Family Medicine | Admitting: *Deleted

## 2019-09-20 ENCOUNTER — Encounter: Payer: Self-pay | Admitting: *Deleted

## 2019-09-20 DIAGNOSIS — M25642 Stiffness of left hand, not elsewhere classified: Secondary | ICD-10-CM | POA: Insufficient documentation

## 2019-09-20 DIAGNOSIS — M79642 Pain in left hand: Secondary | ICD-10-CM | POA: Diagnosis present

## 2019-09-20 DIAGNOSIS — M6281 Muscle weakness (generalized): Secondary | ICD-10-CM | POA: Diagnosis present

## 2019-09-20 NOTE — Therapy (Signed)
[facility] [address] [place], [place], [address] Phone: [phone]   Fax:  [phone]  Occupational Therapy Treatment  Patient Details  Name: Crystal Edwards MRN: [id number] Date of Birth: [birth year] Referring Provider (OT): Dr. Rip Harbour   Encounter Date: 09/20/2019   OT End of Session - 09/20/19 0920    Visit Number 2    Number of Visits 8    Date for OT Re-Evaluation 11/10/19    Authorization Type Medicaid    Authorization Time Period await auth    OT Start Time 0845    OT Stop Time 0911    OT Time Calculation (min) 26 min    Activity Tolerance Patient tolerated treatment well    Behavior During Therapy [facility] for tasks assessed/performed           Past Medical History:  Diagnosis Date  . Anemia   . HPV test positive   . Hypertension     Past Surgical History:  Procedure Laterality Date  . CESAREAN SECTION    . LEEP      There were no vitals filed for this visit.   Subjective Assessment - 09/20/19 0847    Subjective  "I think I hit my finger the other day, I don't know how, but I jammed it somehow."    Pertinent History mallet finger, pt must maintain DIP joint of left 5th digit in extension, splint at all times except for hygine    Patient Stated Goals custom splint, then to regain use of hand for sewing    Currently in Pain? No/denies    Pain Score 0-No pain    Multiple Pain Sites No                        OT Treatments/Exercises (OP) - 09/20/19 0001      ADLs   ADL Comments Pt was educated to keep hand/finger dry at all times. She then performed her home program in the clinic given Min vc's to hold PIP flexion during blocked flexion exercises as she was "bouncing" and not holding for a stretch. She verbalized understanding in the clinic today and returned demonstration after initial vc's      Splinting   Splinting Pt arrived in custom protective splint stating "It's  soaking wet b/c I washed my hands and was in a rush" Splint was removed taking care to maintain slight hyperextension at the DIP of her left small finger. He finger was noted to have min maceration. It was dried well and minor splint adjustments were made to increase comfort and fit. Splint was reapplied and splinting use, care and precautions were reviewed. Pt was educated in this injury usingpictures and written material. She will wear this splint until it is d/c by Dr. Rip Harbour.                    OT Education - 09/20/19 0919    Education Details splint wear, care and precautions, pt was educated to maintain DIP joint in extension at all times, including hand hygine. Keep hand/finger dry at all times.    Person(s) Educated Patient    Methods Explanation;Demonstration;Verbal cues;Handout    Comprehension Verbalized understanding;Returned demonstration;Verbal cues required            OT Short Term Goals - 09/20/19 2563      OT SHORT TERM GOAL #1   Title I with splint wear, care and precautions following 1-2  weeks to ensure proper fit    Baseline dependent splint issued at inital visit    Time 4    Period Weeks    Status On-going      OT SHORT TERM GOAL #2   Title I with A/ROM to PIP and MP joints for left small finger    Baseline dependent    Time 4    Period Weeks    Status On-going             OT Long Term Goals - 09/11/19 1130      OT LONG TERM GOAL #1   Title I with updated HEP.    Baseline dependent    Time 8    Period Weeks    Status New    Target Date 11/10/19      OT LONG TERM GOAL #2   Title Pt will demonstrate 5th digit  DIP extensor lag of -5 or less for increased functional use of LUE.    Baseline not tested due to precautions.    Time 8    Period Weeks    Status New      OT LONG TERM GOAL #3   Title Pt will demonstrate DIP flexion WFLS for ADLs/ IADLs and no reports of pain.    Baseline not tested due to precautions    Time 8    Period  Weeks    Status New      OT LONG TERM GOAL #4   Title Pt will demonstrate LUE grip strength of at least 25 lbs for increased LUE functional use.    Baseline not tested due to precautions    Time 8    Period Weeks    Status New                 Plan - 09/20/19 0920    Clinical Impression Statement Pt should benefit from splint adjustments for increased fit and wear. She should also benefit from pt education Re: review and performance of home program within protective splint for blocked flexion exercises.She states that she will call Dr Zackery Barefoot office to schedule a f/u appointment as she had to cancel her last one.    OT Occupational Profile and History Problem Focused Assessment - Including review of records relating to presenting problem    Occupational performance deficits (Please refer to evaluation for details): ADL's;IADL's;Work;Social Participation    Body Structure / Function / Physical Skills ADL;UE functional use;Flexibility;Pain;FMC;ROM;Gait;GMC;Decreased knowledge of use of DME;IADL;Strength;Improper spinal/pelvic alignment;Dexterity;Mobility;Coordination    Rehab Potential Fair    Clinical Decision Making Limited treatment options, no task modification necessary    Comorbidities Affecting Occupational Performance: None    Modification or Assistance to Complete Evaluation  No modification of tasks or assist necessary to complete eval    OT Frequency 1x / week    OT Duration 8 weeks    OT Treatment/Interventions Self-care/ADL training;Ultrasound;Energy conservation;Therapeutic activities;Neuromuscular education;Therapeutic exercise;Moist Heat;Electrical Stimulation;Functional Mobility Training;Splinting;Fluidtherapy;Paraffin;Passive range of motion;Patient/family education;DME and/or AE instruction;Visual/perceptual remediation/compensation    Plan Splint check and modifications prn, pt will need to wear splint until MD d/c, pt to make follow up appt with MD after having to  cancel her last one.    Consulted and Agree with Plan of Care Patient;Family member/caregiver           Patient will benefit from skilled therapeutic intervention in order to improve the following deficits and impairments:   Body Structure / Function / Physical Skills: ADL, UE functional  use, Flexibility, Pain, FMC, ROM, Gait, GMC, Decreased knowledge of use of DME, IADL, Strength, Improper spinal/pelvic alignment, Dexterity, Mobility, Coordination       Visit Diagnosis: Stiffness of left hand, not elsewhere classified  Pain in left hand  Muscle weakness (generalized)    Problem List Patient Active Problem List   Diagnosis Date Noted  . At high risk for breast cancer 05/22/2019  . Genetic testing 05/26/2015  . Family history of breast cancer in female 04/29/2015  . Family history of colon cancer 04/29/2015  . Abnormal uterine bleeding (AUB) 04/01/2015  . 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 Hypertension 03/15/2014  . Iron deficiency anemia 06/19/2007  . SICKLE CELL TRAIT 06/19/2007  . PANIC ATTACK 06/19/2007  . MIGRAINE HEADACHE 06/19/2007  . ASTHMA 06/19/2007  . INSOMNIA 06/19/2007  . DEPRESSION, HX OF 06/19/2007    Percell Miller Ardath Sax, OTR/L 09/20/2019, 9:25 AM  [facility] [address] [place], [place], [address] Phone: [phone]   Fax:  [phone]  Name: Crystal Edwards MRN: [id number] Date of Birth: 1975-11-07

## 2019-09-27 ENCOUNTER — Encounter: Payer: Self-pay | Admitting: *Deleted

## 2019-09-27 ENCOUNTER — Ambulatory Visit: Payer: Medicaid Other | Admitting: *Deleted

## 2019-09-27 ENCOUNTER — Other Ambulatory Visit: Payer: Self-pay

## 2019-09-27 DIAGNOSIS — M25642 Stiffness of left hand, not elsewhere classified: Secondary | ICD-10-CM | POA: Diagnosis not present

## 2019-09-27 DIAGNOSIS — M79642 Pain in left hand: Secondary | ICD-10-CM

## 2019-09-27 DIAGNOSIS — M6281 Muscle weakness (generalized): Secondary | ICD-10-CM

## 2019-09-27 NOTE — Therapy (Signed)
[facility] [address] [place], [place], [address] Phone: [phone]   Fax:  [phone]  Occupational Therapy Treatment  Patient Details  Name: Crystal Edwards MRN: [id number] Date of Birth: 03-13-1975 Referring Provider (OT): Dr. Rip Harbour   Encounter Date: 09/27/2019   OT End of Session - 09/27/19 0925    Visit Number 3    Number of Visits 8    Date for OT Re-Evaluation 11/10/19    Authorization Type Medicaid    Authorization Time Period await auth    OT Start Time 0850    OT Stop Time 0921    OT Time Calculation (min) 31 min    Activity Tolerance Patient tolerated treatment well    Behavior During Therapy [facility] for tasks assessed/performed           Past Medical History:  Diagnosis Date  . Anemia   . HPV test positive   . Hypertension     Past Surgical History:  Procedure Laterality Date  . CESAREAN SECTION    . LEEP      There were no vitals filed for this visit.   Subjective Assessment - 09/27/19 0912    Subjective  Pt reports that she sleeps on her left hand and splint sometimes "comes off."    Pertinent History mallet finger, pt must maintain DIP joint of left 5th digit in extension, splint at all times except for hygine    Patient Stated Goals custom splint, then to regain use of hand for sewing    Currently in Pain? No/denies    Pain Score 0-No pain    Aggravating Factors  Sometimes pain when sleeping if "it comes off" IF:OYDXA    Multiple Pain Sites No              OT Treatments/Exercises (OP) - 09/27/19 0001      ADLs   ADL Comments Pt reports that she has been able to keep her hand/splint dry. She reports difficulty sleeping with splint on she moves a lot at night and bumps it or removes it w/o knowing. Splint adjustments and trimming nail of left small finger will allow for better fit and wear.    ADL Education Given --   Reviewed HEP     Splinting   Splinting Pt reports  that custom splint is sometimes coming off while she sleeps. A new custom splint was fabricated today w/o perforations for better fit. Splint is held on using 1" coban and allows for Left small finger DIP hyperextension. She was educated in splinting use, care and precautions. She will also benefit from trimming her nail (with assistance) for increased fit and to prevent her nail from rubbing at distal end of splint. She will keep previously fabricated splint as a back up splint.               OT Education - 09/27/19 0925    Education Details splint wear, care and precautions, pt was educated to maintain DIP joint in extension at all times, including hand hygine. Keep hand/finger dry at all times.    Person(s) Educated Patient    Methods Explanation;Demonstration;Verbal cues;Handout    Comprehension Verbalized understanding;Returned demonstration;Verbal cues required            OT Short Term Goals - 09/20/19 1287      OT SHORT TERM GOAL #1   Title I with splint wear, care and precautions following 1-2 weeks to ensure proper fit  Baseline dependent splint issued at inital visit    Time 4    Period Weeks    Status On-going      OT SHORT TERM GOAL #2   Title I with A/ROM to PIP and MP joints for left small finger    Baseline dependent    Time 4    Period Weeks    Status On-going             OT Long Term Goals - 09/11/19 1130      OT LONG TERM GOAL #1   Title I with updated HEP.    Baseline dependent    Time 8    Period Weeks    Status New    Target Date 11/10/19      OT LONG TERM GOAL #2   Title Pt will demonstrate 5th digit  DIP extensor lag of -5 or less for increased functional use of LUE.    Baseline not tested due to precautions.    Time 8    Period Weeks    Status New      OT LONG TERM GOAL #3   Title Pt will demonstrate DIP flexion WFLS for ADLs/ IADLs and no reports of pain.    Baseline not tested due to precautions    Time 8    Period Weeks     Status New      OT LONG TERM GOAL #4   Title Pt will demonstrate LUE grip strength of at least 25 lbs for increased LUE functional use.    Baseline not tested due to precautions    Time 8    Period Weeks    Status New              Plan - 09/27/19 1144    Clinical Impression Statement Pt should benefit from new malett finger splint as fabricated in clinic today, secondary to her reports of previous splint coming off sometimes at night. She will also benefit from trimming her left small finger nail for as this may increase comfort within splint. Pt states that she is going to schedule a f/u w/ Dr Rip Harbour later today.    OT Occupational Profile and History Problem Focused Assessment - Including review of records relating to presenting problem    Occupational performance deficits (Please refer to evaluation for details): ADL's;IADL's;Work;Social Participation    Body Structure / Function / Physical Skills ADL;UE functional use;Flexibility;Pain;FMC;ROM;Gait;GMC;Decreased knowledge of use of DME;IADL;Strength;Improper spinal/pelvic alignment;Dexterity;Mobility;Coordination    Rehab Potential Fair    Clinical Decision Making Limited treatment options, no task modification necessary    Comorbidities Affecting Occupational Performance: None    Modification or Assistance to Complete Evaluation  No modification of tasks or assist necessary to complete eval    OT Frequency 1x / week    OT Duration 8 weeks    OT Treatment/Interventions Self-care/ADL training;Ultrasound;Energy conservation;Therapeutic activities;Neuromuscular education;Therapeutic exercise;Moist Heat;Electrical Stimulation;Functional Mobility Training;Splinting;Fluidtherapy;Paraffin;Passive range of motion;Patient/family education;DME and/or AE instruction;Visual/perceptual remediation/compensation    Plan Splint check and modifications prn, pt will need to wear splint until MD d/c, pt to make follow up appt with MD after forgetting to  last week.    Consulted and Agree with Plan of Care Patient           Patient will benefit from skilled therapeutic intervention in order to improve the following deficits and impairments:   Body Structure / Function / Physical Skills: ADL, UE functional use, Flexibility, Pain, FMC, ROM, Gait, GMC, Decreased  knowledge of use of DME, IADL, Strength, Improper spinal/pelvic alignment, Dexterity, Mobility, Coordination       Visit Diagnosis: Stiffness of left hand, not elsewhere classified  Pain in left hand  Muscle weakness (generalized)    Problem List Patient Active Problem List   Diagnosis Date Noted  . At high risk for breast cancer 05/22/2019  . Genetic testing 05/26/2015  . Family history of breast cancer in female 04/29/2015  . Family history of colon cancer 04/29/2015  . Abnormal uterine bleeding (AUB) 04/01/2015  . 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 Hypertension 03/15/2014  . Iron deficiency anemia 06/19/2007  . SICKLE CELL TRAIT 06/19/2007  . PANIC ATTACK 06/19/2007  . MIGRAINE HEADACHE 06/19/2007  . ASTHMA 06/19/2007  . INSOMNIA 06/19/2007  . DEPRESSION, HX OF 06/19/2007    Percell Miller Ardath Sax, OTR/L 09/27/2019, 11:49 AM  [facility] [address] [place], [place], [address] Phone: [phone]   Fax:  [phone]  Name: JOZALYN BAGLIO MRN: [id number] Date of Birth: 1975-07-01

## 2019-10-04 ENCOUNTER — Encounter: Payer: Self-pay | Admitting: Occupational Therapy

## 2019-10-04 ENCOUNTER — Other Ambulatory Visit: Payer: Self-pay

## 2019-10-04 ENCOUNTER — Ambulatory Visit: Payer: Medicaid Other | Admitting: Occupational Therapy

## 2019-10-04 DIAGNOSIS — M79642 Pain in left hand: Secondary | ICD-10-CM

## 2019-10-04 DIAGNOSIS — M6281 Muscle weakness (generalized): Secondary | ICD-10-CM

## 2019-10-04 DIAGNOSIS — M25642 Stiffness of left hand, not elsewhere classified: Secondary | ICD-10-CM

## 2019-10-04 NOTE — Patient Instructions (Signed)
  Start out performing exercises 3 x day, gradually increase to 4-6 x day if extensor lag is not increasing (Bend at fingertip.) Wear your splint in between exercises and at night, remove splint for exercises several times per day. You can use a hotpack for 8-10 mins on left hand before exercises, make sure to monitor skin closely to avoid burns, do not overheat.  MP Flexion (Active Isolated)   Bend __small____ finger at large knuckle, keeping other fingers straight. Do not bend tips. Repeat _10-15___ times. Do __3-6__ sessions per day.  AROM: PIP Flexion / Extension   Pinch bottom knuckle of ___small_____ finger of hand to prevent bending. Actively bend middle knuckle until stretch is felt. Hold __5__ seconds. Relax. Straighten finger as far as possible. Repeat __10-15__ times per set. Do _4-6___ sessions per day.   AROM: DIP Flexion / Extension   Pinch middle knuckle of ___small_____ finger of  hand to prevent bending. Bend end knuckle until stretch is felt. Hold _5___ seconds. Relax. Straighten finger as far as possible. Repeat _10-15___ times per set.  Do _3-6___ sessions per day.  AROM: Finger Flexion / Extension   Actively bend fingers of  hand. Start with knuckles furthest from palm, and slowly make a fist. Hold __5__ seconds. Relax. Then straighten fingers as far as possible. Repeat _10-15___ times per set.  Do _3-6___ sessions per day.  Copyright  VHI. All rights reserved.

## 2019-10-04 NOTE — Therapy (Signed)
[facility] [address] [place], [place], [address] Phone: [phone]   Fax:  [phone]  Occupational Therapy Treatment  Patient Details  Name: Crystal Edwards MRN: [id number] Date of Birth: 07/27/1975 Referring Provider (OT): Dr. Rip Harbour   Encounter Date: 10/04/2019   OT End of Session - 10/04/19 0858    Visit Number 4    Number of Visits 8    Date for OT Re-Evaluation 11/10/19    Authorization Type Medicaid    Authorization Time Period await auth    Authorization - Visit Number 3    Authorization - Number of Visits 7    OT Start Time 0848    OT Stop Time 0927    OT Time Calculation (min) 39 min    Activity Tolerance Patient tolerated treatment well    Behavior During Therapy [facility] for tasks assessed/performed           Past Medical History:  Diagnosis Date  . Anemia   . HPV test positive   . Hypertension     Past Surgical History:  Procedure Laterality Date  . CESAREAN SECTION    . LEEP      There were no vitals filed for this visit.   Subjective Assessment - 10/04/19 0857    Subjective  Pt reports that MD has told her to begin A/ROM    Pertinent History cleared for A/ROM per MD- pt reports seeing him on 9/21, pt reports MD said she can just wear splint at night, however due to DIP extensor lag pt will progress to this gradually    Patient Stated Goals custom splint, then to regain use of hand for sewing    Currently in Pain? Yes    Pain Score 3     Pain Location Finger (Comment which one)    Pain Orientation Left    Pain Descriptors / Indicators Aching    Pain Type Acute pain    Pain Onset More than a month ago    Pain Frequency Intermittent    Aggravating Factors  movement    Pain Relieving Factors heat                   Treatment: fluidotherapy x 10 mins to LUE, for stiffness and pain, no adverse reactions Pt was instructed in A/ROM HEP, see pt instructions. Pt was  instructed to continue to wear splint between exercises and at night as pt has an extensor lag. Splint was reapplied at end of session with coban.             OT Education - 10/04/19 1248    Education Details updated splint, wear, care schedule, between exercises and at night, A/ROM HEP    Person(s) Educated Patient    Methods Explanation;Demonstration;Verbal cues;Handout    Comprehension Verbalized understanding;Returned demonstration;Verbal cues required            OT Short Term Goals - 10/04/19 1247      OT SHORT TERM GOAL #1   Title I with splint wear, care and precautions following 1-2 weeks to ensure proper fit    Baseline dependent splint issued at inital visit    Time 4    Period Weeks    Status Achieved      OT SHORT TERM GOAL #2   Title I with A/ROM to PIP and MP joints for left small finger    Baseline dependent    Time 4    Period Weeks  Status Achieved             OT Long Term Goals - 09/11/19 1130      OT LONG TERM GOAL #1   Title I with updated HEP.    Baseline dependent    Time 8    Period Weeks    Status New    Target Date 11/10/19      OT LONG TERM GOAL #2   Title Pt will demonstrate 5th digit  DIP extensor lag of -5 or less for increased functional use of LUE.    Baseline not tested due to precautions.    Time 8    Period Weeks    Status New      OT LONG TERM GOAL #3   Title Pt will demonstrate DIP flexion WFLS for ADLs/ IADLs and no reports of pain.    Baseline not tested due to precautions    Time 8    Period Weeks    Status New      OT LONG TERM GOAL #4   Title Pt will demonstrate LUE grip strength of at least 25 lbs for increased LUE functional use.    Baseline not tested due to precautions    Time 8    Period Weeks    Status New                 Plan - 10/04/19 6644    Clinical Impression Statement Pt saw MD and she reports that he has cleared her to begin A/ROM.    OT Occupational Profile and History  Problem Focused Assessment - Including review of records relating to presenting problem    Occupational performance deficits (Please refer to evaluation for details): ADL's;IADL's;Work;Social Participation    Body Structure / Function / Physical Skills ADL;UE functional use;Flexibility;Pain;FMC;ROM;Gait;GMC;Decreased knowledge of use of DME;IADL;Strength;Improper spinal/pelvic alignment;Dexterity;Mobility;Coordination    Rehab Potential Fair    Clinical Decision Making Limited treatment options, no task modification necessary    Comorbidities Affecting Occupational Performance: None    Modification or Assistance to Complete Evaluation  No modification of tasks or assist necessary to complete eval    OT Frequency 1x / week    OT Duration 8 weeks    OT Treatment/Interventions Self-care/ADL training;Ultrasound;Energy conservation;Therapeutic activities;Neuromuscular education;Therapeutic exercise;Moist Heat;Electrical Stimulation;Functional Mobility Training;Splinting;Fluidtherapy;Paraffin;Passive range of motion;Patient/family education;DME and/or AE instruction;Visual/perceptual remediation/compensation    Plan continue with fluidotherapy and A/ROM per mallet finger protocol- pt is still wearing splint between exercises and at night    Consulted and Agree with Plan of Care Patient           Patient will benefit from skilled therapeutic intervention in order to improve the following deficits and impairments:   Body Structure / Function / Physical Skills: ADL, UE functional use, Flexibility, Pain, FMC, ROM, Gait, GMC, Decreased knowledge of use of DME, IADL, Strength, Improper spinal/pelvic alignment, Dexterity, Mobility, Coordination       Visit Diagnosis: Stiffness of left hand, not elsewhere classified  Pain in left hand  Muscle weakness (generalized)    Problem List Patient Active Problem List   Diagnosis Date Noted  . At high risk for breast cancer 05/22/2019  . Genetic testing  05/26/2015  . Family history of breast cancer in female 04/29/2015  . Family history of colon cancer 04/29/2015  . Abnormal uterine bleeding (AUB) 04/01/2015  . 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 Hypertension 03/15/2014  . Iron deficiency anemia 06/19/2007  . SICKLE CELL TRAIT  06/19/2007  . PANIC ATTACK 06/19/2007  . MIGRAINE HEADACHE 06/19/2007  . ASTHMA 06/19/2007  . INSOMNIA 06/19/2007  . DEPRESSION, HX OF 06/19/2007    Belma Dyches 10/04/2019, 12:51 PM  [facility] [address] [place], [place], [address] Phone: [phone]   Fax:  [phone]  Name: TEIRA ARCILLA MRN: [id number] Date of Birth: 11-29-1975

## 2019-10-08 ENCOUNTER — Ambulatory Visit: Payer: Medicaid Other | Admitting: Occupational Therapy

## 2019-10-08 ENCOUNTER — Encounter: Payer: Self-pay | Admitting: Occupational Therapy

## 2019-10-08 ENCOUNTER — Other Ambulatory Visit: Payer: Self-pay

## 2019-10-08 DIAGNOSIS — M79642 Pain in left hand: Secondary | ICD-10-CM

## 2019-10-08 DIAGNOSIS — M25642 Stiffness of left hand, not elsewhere classified: Secondary | ICD-10-CM

## 2019-10-08 DIAGNOSIS — M6281 Muscle weakness (generalized): Secondary | ICD-10-CM

## 2019-10-08 NOTE — Therapy (Signed)
[facility] [address] [place], [place], [address] Phone: [phone]   Fax:  [phone]  Occupational Therapy Treatment  Patient Details  Name: Crystal Edwards MRN: [id number] Date of Birth: Feb 20, 1975 Referring Provider (OT): Dr. Rip Harbour   Encounter Date: 10/08/2019   OT End of Session - 10/08/19 0743    Visit Number 5    Number of Visits 8    Date for OT Re-Evaluation 11/10/19    Authorization Type Medicaid    Authorization Time Period [employer] approved 7 visits through 11/28    Authorization - Visit Number 4    Authorization - Number of Visits 7    OT Start Time [phone]    OT Stop Time 0758    OT Time Calculation (min) 40 min           Past Medical History:  Diagnosis Date  . Anemia   . HPV test positive   . Hypertension     Past Surgical History:  Procedure Laterality Date  . CESAREAN SECTION    . LEEP      There were no vitals filed for this visit.   Subjective Assessment - 10/08/19 0731    Subjective  Pt reports no pain at rest only with exercises    Pertinent History cleared for A/ROM per MD- pt reports seeing him on 9/21, pt reports MD said she can just wear splint at night, however due to DIP extensor lag pt will progress to this gradually    Patient Stated Goals custom splint, then to regain use of hand for sewing    Currently in Pain? No/denies               Treatment: fluidotherapy x 10 mins to LUE, for stiffness and pain, no adverse reactions Reviewed A/ROM HEP, IP blocking, composite flexion, tendon gliding, gentle passive DIP extension following A/ROm flexion Pt was instructed to continue to wear splint between exercises and at night as pt has an extensor lag -15. Ice pack x 8 mins end of session, no adverse reactions.                 OT Education - 10/08/19 0959    Education Details tendon gliding exercise, reviewed A/ROM HEP, pt was instructed to  gradually reduce splint wearing time but to monitor extension lag clossely and to donn splint more if extension lag increased    Person(s) Educated Patient    Methods Explanation;Demonstration;Verbal cues;Handout    Comprehension Verbalized understanding;Returned demonstration;Verbal cues required            OT Short Term Goals - 10/04/19 1247      OT SHORT TERM GOAL #1   Title I with splint wear, care and precautions following 1-2 weeks to ensure proper fit    Baseline dependent splint issued at inital visit    Time 4    Period Weeks    Status Achieved      OT SHORT TERM GOAL #2   Title I with A/ROM to PIP and MP joints for left small finger    Baseline dependent    Time 4    Period Weeks    Status Achieved             OT Long Term Goals - 09/11/19 1130      OT LONG TERM GOAL #1   Title I with updated HEP.    Baseline dependent    Time 8  Period Weeks    Status New    Target Date 11/10/19      OT LONG TERM GOAL #2   Title Pt will demonstrate 5th digit  DIP extensor lag of -5 or less for increased functional use of LUE.    Baseline not tested due to precautions.    Time 8    Period Weeks    Status New      OT LONG TERM GOAL #3   Title Pt will demonstrate DIP flexion WFLS for ADLs/ IADLs and no reports of pain.    Baseline not tested due to precautions    Time 8    Period Weeks    Status New      OT LONG TERM GOAL #4   Title Pt will demonstrate LUE grip strength of at least 25 lbs for increased LUE functional use.    Baseline not tested due to precautions    Time 8    Period Weeks    Status New                  Patient will benefit from skilled therapeutic intervention in order to improve the following deficits and impairments:           Visit Diagnosis: Pain in left hand  Stiffness of left hand, not elsewhere classified  Muscle weakness (generalized)    Problem List Patient Active Problem List   Diagnosis Date Noted  . At  high risk for breast cancer 05/22/2019  . Genetic testing 05/26/2015  . Family history of breast cancer in female 04/29/2015  . Family history of colon cancer 04/29/2015  . Abnormal uterine bleeding (AUB) 04/01/2015  . 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 Hypertension 03/15/2014  . Iron deficiency anemia 06/19/2007  . SICKLE CELL TRAIT 06/19/2007  . PANIC ATTACK 06/19/2007  . MIGRAINE HEADACHE 06/19/2007  . ASTHMA 06/19/2007  . INSOMNIA 06/19/2007  . DEPRESSION, HX OF 06/19/2007    Crystal Edwards 10/08/2019, 12:24 PM  [facility] [address] [place], [place], [address] Phone: [phone]   Fax:  [phone]  Name: Crystal Edwards MRN: [id number] Date of Birth: 25-Jan-1975

## 2019-10-08 NOTE — Patient Instructions (Signed)
Flexor Tendon Gliding (Active Hook Fist)   With fingers and knuckles straight, bend middle and tip joints. Do not bend large knuckles. Repeat _10-15___ times. Do _4-6___ sessions per day.  MP Flexion (Active)   With back of hand on table, bend large knuckles as far as they will go, keeping small joints straight. Repeat _10-15___ times. Do __4-6__ sessions per day. Activity: Reach into a narrow container.*      Finger Flexion / Extension   With palm up, bend fingers of left hand toward palm, making a  fist. Straighten fingers, opening fist. Repeat sequence _10-15___ times per session. Do _4-6__ sessions per day. Hand Variation: Palm down   Copyright  VHI. All rights reserved.   

## 2019-10-17 ENCOUNTER — Encounter: Payer: Self-pay | Admitting: *Deleted

## 2019-10-17 ENCOUNTER — Ambulatory Visit: Payer: Medicaid Other | Attending: Family Medicine | Admitting: *Deleted

## 2019-10-17 ENCOUNTER — Other Ambulatory Visit: Payer: Self-pay

## 2019-10-17 DIAGNOSIS — M25642 Stiffness of left hand, not elsewhere classified: Secondary | ICD-10-CM | POA: Insufficient documentation

## 2019-10-17 DIAGNOSIS — M6281 Muscle weakness (generalized): Secondary | ICD-10-CM | POA: Diagnosis present

## 2019-10-17 DIAGNOSIS — M79642 Pain in left hand: Secondary | ICD-10-CM | POA: Diagnosis present

## 2019-10-17 NOTE — Therapy (Signed)
[facility] [address] [place], [place], [address] Phone: [phone]   Fax:  [phone]  Occupational Therapy Treatment  Patient Details  Name: Crystal Edwards MRN: [id number] Date of Birth: [birth year] Referring Provider (OT): Dr. Rip Harbour   Encounter Date: 10/17/2019   OT End of Session - 10/17/19 1057    Visit Number 6    Number of Visits 8    Date for OT Re-Evaluation 11/10/19    Authorization Type Medicaid    Authorization Time Period [employer] approved 7 visits through 11/28    Authorization - Number of Visits 7    OT Start Time 1015    OT Stop Time 1052    OT Time Calculation (min) 37 min    Activity Tolerance Patient tolerated treatment well    Behavior During Therapy [facility] for tasks assessed/performed           Past Medical History:  Diagnosis Date  . Anemia   . HPV test positive   . Hypertension     Past Surgical History:  Procedure Laterality Date  . CESAREAN SECTION    . LEEP      There were no vitals filed for this visit.   Subjective Assessment - 10/17/19 1024    Subjective  Pt reports that MD told her that she should not wear her splint during the day if she can. She has noted that her Left small DIP lags as the day progresses and was advised to monitor this and wear the splint if she notices that lag (as previously instructed in this clinic).    Pertinent History cleared for A/ROM per MD- pt reports seeing him on 9/21, pt reports MD said she can just wear splint at night, however due to DIP extensor lag pt will progress to this gradually    Patient Stated Goals custom splint, then to regain use of hand for sewing    Currently in Pain? No/denies    Pain Score 0-No pain    Multiple Pain Sites No              [facility] OT Assessment - 10/17/19 0001      ROM / Strength   AROM / PROM / Strength AROM      Left Hand AROM   L Little DIP 0-70 --   -14* at DIP                     OT Treatments/Exercises (OP) - 10/17/19 0001      ADLs   ADL Comments Pt has been cleared by her MD to not wear during the day but can wear at night  if she notes an extensor lag.       Exercises   Exercises Hand      Hand Exercises   Other Hand Exercises active blocked PIP flexion and DIP flexion, tendon gliding and composite flexion, gentle PROM DIP extension following A/ROM for flexion.       Modalities   Modalities Fluidotherapy      LUE Fluidotherapy   Number Minutes Fluidotherapy 10 Minutes    LUE Fluidotherapy Location Hand    Comments L hand in fluido while performing A/ROM and tendon gliding exercises through out the session. Pt reports increased flexibility with heat rather than ice.      Splinting   Splinting Pt wearing splint at night and PRN during the day as she monitors extensor lag.  OT Education - 10/17/19 1056    Education Details Reviewed tendon gliding exercise, A/ROM HEP, and to monitor extension lag clossely as she is reducing splint use and to donn splint more if extension lag increases.    Person(s) Educated Patient    Methods Explanation;Demonstration;Verbal cues;Handout    Comprehension Verbalized understanding;Returned demonstration;Verbal cues required            OT Short Term Goals - 10/17/19 1100      OT SHORT TERM GOAL #1   Title I with splint wear, care and precautions following 1-2 weeks to ensure proper fit    Baseline dependent splint issued at inital visit    Time 4    Period Weeks    Status Achieved      OT SHORT TERM GOAL #2   Title I with A/ROM to PIP and MP joints for left small finger    Baseline dependent    Time 4    Period Weeks    Status Achieved             OT Long Term Goals - 09/11/19 1130      OT LONG TERM GOAL #1   Title I with updated HEP.    Baseline dependent    Time 8    Period Weeks    Status New    Target Date 11/10/19      OT LONG TERM GOAL #2    Title Pt will demonstrate 5th digit  DIP extensor lag of -5 or less for increased functional use of LUE.    Baseline not tested due to precautions.    Time 8    Period Weeks    Status New      OT LONG TERM GOAL #3   Title Pt will demonstrate DIP flexion WFLS for ADLs/ IADLs and no reports of pain.    Baseline not tested due to precautions    Time 8    Period Weeks    Status New      OT LONG TERM GOAL #4   Title Pt will demonstrate LUE grip strength of at least 25 lbs for increased LUE functional use.    Baseline not tested due to precautions    Time 8    Period Weeks    Status New                 Plan - 10/17/19 1057    Clinical Impression Statement Pt reports that she has been decreasing use of extensor splint and is performing A/ROM/HEP, she will benefit from continued monitoring of extensor lag left small finger. A/ROM assessment today reveals -14* lag at Left small PIPJ.    OT Occupational Profile and History Problem Focused Assessment - Including review of records relating to presenting problem    Occupational performance deficits (Please refer to evaluation for details): ADL's;IADL's;Work;Social Participation    Body Structure / Function / Physical Skills ADL;UE functional use;Flexibility;Pain;FMC;ROM;Gait;GMC;Decreased knowledge of use of DME;IADL;Strength;Improper spinal/pelvic alignment;Dexterity;Mobility;Coordination    Rehab Potential Fair    Clinical Decision Making Limited treatment options, no task modification necessary    Comorbidities Affecting Occupational Performance: None    Modification or Assistance to Complete Evaluation  No modification of tasks or assist necessary to complete eval    OT Frequency 1x / week    OT Duration 8 weeks    OT Treatment/Interventions Self-care/ADL training;Ultrasound;Energy conservation;Therapeutic activities;Neuromuscular education;Therapeutic exercise;Moist Heat;Electrical Stimulation;Functional Mobility  Training;Splinting;Fluidtherapy;Paraffin;Passive range of motion;Patient/family education;DME and/or AE instruction;Visual/perceptual remediation/compensation  Plan continue with fluidotherapy and A/ROM per mallet finger protocol- pt is decreasing wear/use of splint between exercises but continues to wear at night.    Consulted and Agree with Plan of Care Patient           Patient will benefit from skilled therapeutic intervention in order to improve the following deficits and impairments:   Body Structure / Function / Physical Skills: ADL, UE functional use, Flexibility, Pain, FMC, ROM, Gait, GMC, Decreased knowledge of use of DME, IADL, Strength, Improper spinal/pelvic alignment, Dexterity, Mobility, Coordination       Visit Diagnosis: Pain in left hand  Stiffness of left hand, not elsewhere classified  Muscle weakness (generalized)    Problem List Patient Active Problem List   Diagnosis Date Noted  . At high risk for breast cancer 05/22/2019  . Genetic testing 05/26/2015  . Family history of breast cancer in female 04/29/2015  . Family history of colon cancer 04/29/2015  . Abnormal uterine bleeding (AUB) 04/01/2015  . 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 Hypertension 03/15/2014  . Iron deficiency anemia 06/19/2007  . SICKLE CELL TRAIT 06/19/2007  . PANIC ATTACK 06/19/2007  . MIGRAINE HEADACHE 06/19/2007  . ASTHMA 06/19/2007  . INSOMNIA 06/19/2007  . DEPRESSION, HX OF 06/19/2007    Percell Miller Ardath Sax, OTR/L 10/17/2019, 12:14 PM  [facility] [address] [place], [place], [address] Phone: [phone]   Fax:  [phone]  Name: Crystal Edwards MRN: [id number] Date of Birth: [birth year]

## 2019-10-24 ENCOUNTER — Ambulatory Visit: Payer: Medicaid Other | Admitting: *Deleted

## 2019-10-31 ENCOUNTER — Other Ambulatory Visit: Payer: Self-pay

## 2019-10-31 ENCOUNTER — Ambulatory Visit: Payer: Medicaid Other | Admitting: Occupational Therapy

## 2019-10-31 DIAGNOSIS — M6281 Muscle weakness (generalized): Secondary | ICD-10-CM

## 2019-10-31 DIAGNOSIS — M79642 Pain in left hand: Secondary | ICD-10-CM | POA: Diagnosis not present

## 2019-10-31 DIAGNOSIS — M25642 Stiffness of left hand, not elsewhere classified: Secondary | ICD-10-CM

## 2019-10-31 NOTE — Patient Instructions (Signed)
Dr. Rip Harbour,  Crystal Edwards has been receiving occupational therapy at out site. She has been performing A/ROM and gentle P/ROM. She continues to demonstrate grossly -15 extensor lag.  PIP flexion 85*, DIP flexion 60* Pt continues to wear her splint at night time and prn during the day if extensor lag increases significantly.  She continues to be limited by pain and stiffness.  We anticipate discharging at her next visit as progress has plateaued. Please provide any recommendations. Sincerely,   Theone Murdoch, OTR/L

## 2019-10-31 NOTE — Therapy (Signed)
[facility] [address] [place], [place], [address] Phone: [phone]   Fax:  [phone]  Occupational Therapy Treatment  Patient Details  Name: Crystal Edwards MRN: [id number] Date of Birth: 1975-07-08 Referring Provider (OT): Dr. Rip Harbour   Encounter Date: 10/31/2019   OT End of Session - 10/31/19 1024    Visit Number 7    Number of Visits 8    Date for OT Re-Evaluation 11/10/19    Authorization Type Medicaid    Authorization Time Period [employer] approved 7 visits through 11/28    Authorization - Visit Number 6    Authorization - Number of Visits 7    OT Start Time 6962    OT Stop Time 1058    OT Time Calculation (min) 40 min    Activity Tolerance Patient tolerated treatment well    Behavior During Therapy [facility] for tasks assessed/performed           Past Medical History:  Diagnosis Date  . Anemia   . HPV test positive   . Hypertension     Past Surgical History:  Procedure Laterality Date  . CESAREAN SECTION    . LEEP      There were no vitals filed for this visit.   Subjective Assessment - 10/31/19 1022    Subjective  Pt reports she removes splint most of the day, she wears splint at night    Pertinent History cleared for A/ROM per MD- pt reports seeing him on 9/21, pt reports MD said she can just wear splint at night, however due to DIP extensor lag pt will progress to this gradually    Patient Stated Goals custom splint, then to regain use of hand for sewing    Currently in Pain? Yes    Pain Score 3     Pain Location Finger (Comment which one)    Pain Orientation Left    Pain Descriptors / Indicators Aching    Pain Type Acute pain    Pain Onset More than a month ago    Pain Frequency Intermittent    Aggravating Factors  movement    Pain Relieving Factors heat                Treatment:  Hand Exercises   Other Hand Exercises active blocked PIP flexion and DIP flexion,  tendon gliding and composite flexion, gentle PROM DIP extension following A/ROM for flexion.       Modalities   Modalities Fluidotherapy      LUE Fluidotherapy   Number Minutes Fluidotherapy 10 Minutes    LUE Fluidotherapy Location Hand    Comments L hand in fluido while performing A/ROM and tendon gliding exercises through out the session.Marland Kitchen      Splinting   Splinting Pt wearing splint at night and PRN during the day as she monitors extensor lag.        Note sent with pt to MD see pt instructions                OT Short Term Goals - 10/17/19 1100      OT SHORT TERM GOAL #1   Title I with splint wear, care and precautions following 1-2 weeks to ensure proper fit    Baseline dependent splint issued at inital visit    Time 4    Period Weeks    Status Achieved      OT SHORT TERM GOAL #2   Title I with A/ROM  to PIP and MP joints for left small finger    Baseline dependent    Time 4    Period Weeks    Status Achieved             OT Long Term Goals - 10/31/19 1211      OT LONG TERM GOAL #1   Title I with updated HEP.    Baseline dependent    Time 8    Period Weeks    Status On-going      OT LONG TERM GOAL #2   Title Pt will demonstrate 5th digit  DIP extensor lag of -5 or less for increased functional use of LUE.    Baseline not tested due to precautions.    Time 8    Period Weeks    Status On-going   -15     OT LONG TERM GOAL #3   Title Pt will demonstrate DIP flexion WFLS for ADLs/ IADLs and no reports of pain.    Baseline not tested due to precautions    Time 8    Period Weeks    Status On-going      OT LONG TERM GOAL #4   Title Pt will demonstrate LUE grip strength of at least 25 lbs for increased LUE functional use.    Baseline not tested due to precautions    Time 8    Period Weeks    Status Achieved   28lbs                Plan - 10/31/19 1024    Clinical Impression Statement Pt reports that she has been decreasing use of  extensor splint and is performing A/ROM/HEP. Pt continues to ehibit a -15 extensor lag for LUE.    OT Occupational Profile and History Problem Focused Assessment - Including review of records relating to presenting problem    Occupational performance deficits (Please refer to evaluation for details): ADL's;IADL's;Work;Social Participation    Body Structure / Function / Physical Skills ADL;UE functional use;Flexibility;Pain;FMC;ROM;Gait;GMC;Decreased knowledge of use of DME;IADL;Strength;Improper spinal/pelvic alignment;Dexterity;Mobility;Coordination    Rehab Potential Fair    Clinical Decision Making Limited treatment options, no task modification necessary    Comorbidities Affecting Occupational Performance: None    Modification or Assistance to Complete Evaluation  No modification of tasks or assist necessary to complete eval    OT Frequency 1x / week    OT Duration 8 weeks    OT Treatment/Interventions Self-care/ADL training;Ultrasound;Energy conservation;Therapeutic activities;Neuromuscular education;Therapeutic exercise;Moist Heat;Electrical Stimulation;Functional Mobility Training;Splinting;Fluidtherapy;Paraffin;Passive range of motion;Patient/family education;DME and/or AE instruction;Visual/perceptual remediation/compensation    Plan continue with fluidotherapy and A/ROM per mallet finger protocol-check MD recommendations    Consulted and Agree with Plan of Care Patient           Patient will benefit from skilled therapeutic intervention in order to improve the following deficits and impairments:   Body Structure / Function / Physical Skills: ADL, UE functional use, Flexibility, Pain, FMC, ROM, Gait, GMC, Decreased knowledge of use of DME, IADL, Strength, Improper spinal/pelvic alignment, Dexterity, Mobility, Coordination       Visit Diagnosis: Pain in left hand  Stiffness of left hand, not elsewhere classified  Muscle weakness (generalized)    Problem List Patient Active  Problem List   Diagnosis Date Noted  . At high risk for breast cancer 05/22/2019  . Genetic testing 05/26/2015  . Family history of breast cancer in female 04/29/2015  . Family history of colon cancer 04/29/2015  . Abnormal uterine  bleeding (AUB) 04/01/2015  . 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 Hypertension 03/15/2014  . Iron deficiency anemia 06/19/2007  . SICKLE CELL TRAIT 06/19/2007  . PANIC ATTACK 06/19/2007  . MIGRAINE HEADACHE 06/19/2007  . ASTHMA 06/19/2007  . INSOMNIA 06/19/2007  . DEPRESSION, HX OF 06/19/2007    Jalan Fariss 10/31/2019, 12:12 PM  [facility] [address] [place], [place], [address] Phone: [phone]   Fax:  [phone]  Name: Crystal Edwards MRN: [id number] Date of Birth: 11-11-1975

## 2019-11-07 ENCOUNTER — Ambulatory Visit: Payer: Medicaid Other | Admitting: Occupational Therapy

## 2020-04-26 ENCOUNTER — Emergency Department
Admission: EM | Admit: 2020-04-26 | Discharge: 2020-04-26 | Disposition: A | Discharge status: Home / Self Care | Payer: Medicaid Other | Attending: Emergency Medicine | Admitting: Emergency Medicine

## 2020-04-26 ENCOUNTER — Encounter: Payer: Self-pay | Admitting: Emergency Medicine

## 2020-04-26 ENCOUNTER — Other Ambulatory Visit: Payer: Self-pay

## 2020-04-26 DIAGNOSIS — Z79899 Other long term (current) drug therapy: Secondary | ICD-10-CM | POA: Insufficient documentation

## 2020-04-26 DIAGNOSIS — D649 Anemia, unspecified: Secondary | ICD-10-CM | POA: Insufficient documentation

## 2020-04-26 DIAGNOSIS — I10 Essential (primary) hypertension: Secondary | ICD-10-CM | POA: Diagnosis not present

## 2020-04-26 DIAGNOSIS — J45909 Unspecified asthma, uncomplicated: Secondary | ICD-10-CM | POA: Diagnosis not present

## 2020-04-26 DIAGNOSIS — R5383 Other fatigue: Secondary | ICD-10-CM | POA: Diagnosis present

## 2020-04-26 LAB — CBC WITH DIFFERENTIAL/PLATELET
Abs Immature Granulocytes: 0.01 10*3/uL (ref 0.00–0.07)
Basophils Absolute: 0 10*3/uL (ref 0.0–0.1)
Basophils Relative: 0 %
Eosinophils Absolute: 0.1 10*3/uL (ref 0.0–0.5)
Eosinophils Relative: 1 %
HCT: 29.3 % — ABNORMAL LOW (ref 36.0–46.0)
Hemoglobin: 9 g/dL — ABNORMAL LOW (ref 12.0–15.0)
Immature Granulocytes: 0 %
Lymphocytes Relative: 34 %
Lymphs Abs: 1.4 10*3/uL (ref 0.7–4.0)
MCH: 23.2 pg — ABNORMAL LOW (ref 26.0–34.0)
MCHC: 30.7 g/dL (ref 30.0–36.0)
MCV: 75.5 fL — ABNORMAL LOW (ref 80.0–100.0)
Monocytes Absolute: 0.2 10*3/uL (ref 0.1–1.0)
Monocytes Relative: 6 %
Neutro Abs: 2.5 10*3/uL (ref 1.7–7.7)
Neutrophils Relative %: 59 %
Platelets: 280 10*3/uL (ref 150–400)
RBC: 3.88 MIL/uL (ref 3.87–5.11)
RDW: 17 % — ABNORMAL HIGH (ref 11.5–15.5)
WBC: 4.2 10*3/uL (ref 4.0–10.5)
nRBC: 0 % (ref 0.0–0.2)

## 2020-04-26 LAB — TYPE AND SCREEN
ABO/RH(D): O POS
Antibody Screen: NEGATIVE

## 2020-04-26 LAB — RETICULOCYTES
Immature Retic Fract: 20.3 % — ABNORMAL HIGH (ref 2.3–15.9)
RBC.: 3.77 MIL/uL — ABNORMAL LOW (ref 3.87–5.11)
Retic Count, Absolute: 53.2 10*3/uL (ref 19.0–186.0)
Retic Ct Pct: 1.4 % (ref 0.4–3.1)

## 2020-04-26 LAB — COMPREHENSIVE METABOLIC PANEL
ALT: 10 U/L (ref 0–44)
AST: 15 U/L (ref 15–41)
Albumin: 3.6 g/dL (ref 3.5–5.0)
Alkaline Phosphatase: 55 U/L (ref 38–126)
Anion gap: 7 (ref 5–15)
BUN: 7 mg/dL (ref 6–20)
CO2: 22 mmol/L (ref 22–32)
Calcium: 8.3 mg/dL — ABNORMAL LOW (ref 8.9–10.3)
Chloride: 108 mmol/L (ref 98–111)
Creatinine, Ser: 0.57 mg/dL (ref 0.44–1.00)
GFR, Estimated: >60 mL/min (ref >60)
Glucose, Bld: 114 mg/dL — ABNORMAL HIGH (ref 70–99)
Potassium: 3.3 mmol/L — ABNORMAL LOW (ref 3.5–5.1)
Sodium: 137 mmol/L (ref 135–145)
Total Bilirubin: 0.5 mg/dL (ref 0.3–1.2)
Total Protein: 7.2 g/dL (ref 6.5–8.1)

## 2020-04-26 LAB — IRON AND TIBC
Iron: 230 ug/dL — ABNORMAL HIGH (ref 28–170)
Saturation Ratios: 57 % — ABNORMAL HIGH (ref 10.4–31.8)
TIBC: 407 ug/dL (ref 250–450)
UIBC: 177 ug/dL

## 2020-04-26 LAB — FOLATE: Folate: 16.7 ng/mL (ref >5.9)

## 2020-04-26 LAB — FERRITIN: Ferritin: 5 ng/mL — ABNORMAL LOW (ref 11–307)

## 2020-04-26 LAB — VITAMIN B12: Vitamin B-12: 231 pg/mL (ref 180–914)

## 2020-04-26 LAB — TSH: TSH: 0.688 u[IU]/mL (ref 0.350–4.500)

## 2020-04-26 NOTE — ED Notes (Signed)
Pt given remote to tv.

## 2020-04-26 NOTE — Discharge Instructions (Addendum)
You were seen today for fatigue.  You do have anemia but I do not think this is related to your previous iron deficiency.  You have a B12 and folate pending.  Please continue your oral iron supplement and follow-up with your hematologist next week if able.  You can take melatonin 3 mg OTC at night for insomnia.

## 2020-04-26 NOTE — ED Notes (Addendum)
Pt states has been fatigued and has history of anemia. Denies SOB. States sometimes her "fingers will turn blue" but that it eventually "goes away". States gets iron infusions at [facility] sometimes. Resp reg/unlabored. Pt walking around room steadily when this RN entered. Pt requesting remote to tv to adjust volume. States has been "eating ice like crazy" lately.

## 2020-04-26 NOTE — ED Triage Notes (Signed)
Pt to ER states "I don't feel well". Pt states feels tired and it is usually either her BP or her iron is low.

## 2020-04-26 NOTE — ED Provider Notes (Signed)
[facility] Emergency Department Provider Note ____________________________________________  Time seen: 1400  I have reviewed the triage vital signs and the nursing notes.  HISTORY  Chief Complaint  Weakness   HPI Crystal Edwards is a 45 y.o. female presents to the ER today with complaint of fatigue.  She reports she noticed this 2 to 3 months ago.  She reports she is not sure what is wrong but she knows something is "off".  She reports history of anemia secondary to uterine fibroids.  She reports monthly periods that last for about 30 hours and are very heavy but then resolved.  She reports history of anemia, taking oral iron and intermittently gets IV iron infusions with [facility] hematology.  She reports she was supposed to have an iron infusion 2 months ago but was unable to get this done due to a death in the family.  She denies S/S of bleeding elsewhere.  She has been chewing on ice but denies cold intolerance or shortness of breath.  She reports she is able to fall asleep but has not been able to stay asleep for some time and is unsure why.  She reports she is never really had sleeping issues before.  She denies anxiety, depression, feeling overwhelmed or stressed.  She has no history of thyroid disorder.  She has not taken anything OTC for the symptoms.  Past Medical History:  Diagnosis Date  . Anemia   . HPV test positive   . Hypertension     Patient Active Problem List   Diagnosis Date Noted  . At high risk for breast cancer 05/22/2019  . Genetic testing 05/26/2015  . Family history of breast cancer in female 04/29/2015  . Family history of colon cancer 04/29/2015  . Abnormal uterine bleeding (AUB) 04/01/2015  . 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 Hypertension 03/15/2014  . Iron deficiency anemia 06/19/2007  . SICKLE CELL TRAIT 06/19/2007  . PANIC ATTACK 06/19/2007  . MIGRAINE HEADACHE 06/19/2007  . ASTHMA 06/19/2007  .  INSOMNIA 06/19/2007  . DEPRESSION, HX OF 06/19/2007    Past Surgical History:  Procedure Laterality Date  . CESAREAN SECTION    . LEEP      Prior to Admission medications   Medication Sig Start Date End Date Taking? Authorizing Provider  iron polysaccharides (NIFEREX) 150 MG capsule Take 1 capsule (150 mg total) by mouth daily. Patient not taking: Reported on 11/25/2015 06/20/15   Shelly Bombard, MD  lisinopril-hydrochlorothiazide (PRINZIDE,ZESTORETIC) 10-12.5 MG tablet Take 1 tablet by mouth daily. 02/25/15   [provider]  tamoxifen (NOLVADEX) 20 MG tablet Take 1 tablet (20 mg total) by mouth daily. Patient not taking: Reported on 09/20/2019 05/22/19   Nicholas Lose, MD    Allergies Sulfa antibiotics  Family History  Problem Relation Age of Onset  . Hypertension Mother   . Breast cancer Mother 35  . Lung cancer Mother 25       smoker  . Fibroids Mother        +TAH  . Breast cancer Maternal Aunt 70  . Prostate cancer Maternal Uncle 58  . Colon cancer Maternal Grandmother 65  . Breast cancer Maternal Grandmother        dx. 82-85  . Lung cancer Paternal Grandmother        d. 5; smoker  . Cancer Other   . Hypertension Other   . Diabetes Other     Social History Social History  Tobacco Use  . Smoking status: Never Smoker  . Smokeless tobacco: Never Used  Substance Use Topics  . Alcohol use: No    Alcohol/week: 0.0 standard drinks  . Drug use: No    Review of Systems  Constitutional: Patient reports fatigue.  Negative for fever, chills or body aches. Eyes: Negative for visual changes. Cardiovascular: Negative for chest pain or chest tightness. Respiratory: Negative for shortness of breath. Gastrointestinal: Negative for blood in stool. Genitourinary: Patient reports heavy menses secondary to fibroids.  Negative for blood in urine. Skin: Negative for cold intolerance. Neurological: Patient reports insomnia.  Negative for focal weakness, tingling or  numbness. ____________________________________________  PHYSICAL EXAM:  VITAL SIGNS: ED Triage Vitals  Enc Vitals Group     BP 04/26/20 1349 (!) 159/92     Pulse Rate 04/26/20 1349 72     Resp 04/26/20 1349 18     Temp 04/26/20 1349 98.1 F (36.7 C)     Temp src --      SpO2 04/26/20 1349 100 %     Weight 04/26/20 1350 195 lb (88.5 kg)     Height 04/26/20 1350 5\' 8"  (1.727 m)     Head Circumference --      Peak Flow --      Pain Score 04/26/20 1349 0     Pain Loc --      Pain Edu? --      Excl. in [place]? --     Constitutional: Alert and oriented. Well appearing and in no distress. Head: Normocephalic.. Eyes:  Normal extraocular movements Neck: Supple. No thyromegaly. Hematological/Lymphatic/Immunological: No cervical lymphadenopathy. Cardiovascular: Normal rate, regular rhythm.  Respiratory: Normal respiratory effort. No wheezes/rales/rhonchi. Musculoskeletal: No difficulty with gait. Neurologic:  Normal speech and language. No gross focal neurologic deficits are appreciated. Skin:  Skin is warm, dry and intact. No bruising noted. Psychiatric: Mood and affect are normal. Patient exhibits appropriate insight and judgment. ____________________________________________   LABS     INITIAL IMPRESSION / ASSESSMENT AND PLAN / ED COURSE  Fatigue, Hx of Iron Deficiency Anemia, Insomnia:  Will check Type and Screen, CBC with Diff, IBC panel, Ferritin, CMET and TSH Add on B12, Folate and Reticulocyte count- pending No indication for transfusion at this time She will continue current meds at this time She will follow up with [facility] as an outpatient Advised her to try Melatonin 3 mg OTC for insomnia ____________________________________________  FINAL CLINICAL IMPRESSION(S) / ED DIAGNOSES  Final diagnoses:  Other fatigue  Anemia, unspecified type      Jearld Fenton, NP 04/26/20 1545    Lucrezia Starch, MD 04/26/20 1559

## 2020-04-26 NOTE — ED Notes (Signed)
Called lab to add on blood work. Lab requested lav, SST, and another grn tube. Will send soon.

## 2020-05-10 ENCOUNTER — Encounter: Payer: Self-pay | Admitting: Emergency Medicine

## 2020-05-10 ENCOUNTER — Emergency Department
Admission: EM | Admit: 2020-05-10 | Discharge: 2020-05-10 | Disposition: A | Discharge status: Home / Self Care | Payer: Medicaid Other | Attending: Emergency Medicine | Admitting: Emergency Medicine

## 2020-05-10 ENCOUNTER — Other Ambulatory Visit: Payer: Self-pay

## 2020-05-10 DIAGNOSIS — I10 Essential (primary) hypertension: Secondary | ICD-10-CM | POA: Insufficient documentation

## 2020-05-10 DIAGNOSIS — G43909 Migraine, unspecified, not intractable, without status migrainosus: Secondary | ICD-10-CM | POA: Insufficient documentation

## 2020-05-10 DIAGNOSIS — Z79899 Other long term (current) drug therapy: Secondary | ICD-10-CM | POA: Diagnosis not present

## 2020-05-10 DIAGNOSIS — R519 Headache, unspecified: Secondary | ICD-10-CM | POA: Diagnosis present

## 2020-05-10 MED ORDER — DROPERIDOL 2.5 MG/ML IJ SOLN
2.5000 mg | Freq: Once | INTRAMUSCULAR | Status: AC
  Administered 2020-05-10: 2.5 mg via INTRAVENOUS
  Filled 2020-05-10: qty 2

## 2020-05-10 MED ORDER — ACETAMINOPHEN 325 MG PO TABS
650.0000 mg | ORAL_TABLET | Freq: Once | ORAL | Status: AC
  Administered 2020-05-10: 650 mg via ORAL
  Filled 2020-05-10: qty 2

## 2020-05-10 MED ORDER — SODIUM CHLORIDE 0.9 % IV BOLUS
500.0000 mL | Freq: Once | INTRAVENOUS | Status: AC
  Administered 2020-05-10: 500 mL via INTRAVENOUS

## 2020-05-10 MED ORDER — DEXAMETHASONE SODIUM PHOSPHATE 10 MG/ML IJ SOLN
10.0000 mg | Freq: Once | INTRAMUSCULAR | Status: AC
  Administered 2020-05-10: 10 mg via INTRAVENOUS
  Filled 2020-05-10: qty 1

## 2020-05-10 MED ORDER — DIPHENHYDRAMINE HCL 50 MG/ML IJ SOLN
12.5000 mg | INTRAMUSCULAR | Status: AC
  Administered 2020-05-10: 12.5 mg via INTRAVENOUS
  Filled 2020-05-10: qty 1

## 2020-05-10 MED ORDER — KETOROLAC TROMETHAMINE 30 MG/ML IJ SOLN
15.0000 mg | Freq: Once | INTRAMUSCULAR | Status: AC
  Administered 2020-05-10: 15 mg via INTRAVENOUS
  Filled 2020-05-10: qty 1

## 2020-05-10 NOTE — ED Provider Notes (Signed)
[facility] Emergency Department Provider Note  ____________________________________________   Event Date/Time   First MD Initiated Contact with Patient 05/10/20 [phone]     (approximate)  I have reviewed the triage vital signs and the nursing notes.   HISTORY  Chief Complaint Headache    HPI Crystal Edwards is a 45 y.o. female who presents for evaluation of generalized frontal headache with photophobia.  She said that she had a history of migraines years ago but it is not a common issue for her.   However recently she was having some trouble with her allergies and she felt onset of a severe frontal headache at that point.  Even after her allergies are better she has had intermittent issues with headache.  The headache was more severe tonight.  Bright lights and loud noises made it worse, nothing in particular made it better.  She denies neck pain, neck stiffness, fever/chills, sore throat, chest pain, shortness of breath, abdominal pain, and vomiting.  She had some nausea but the nausea has improved.  No visual changes.        Past Medical History:  Diagnosis Date  . Anemia   . HPV test positive   . Hypertension     Patient Active Problem List   Diagnosis Date Noted  . At high risk for breast cancer 05/22/2019  . Genetic testing 05/26/2015  . Family history of breast cancer in female 04/29/2015  . Family history of colon cancer 04/29/2015  . Abnormal uterine bleeding (AUB) 04/01/2015  . 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 Hypertension 03/15/2014  . Iron deficiency anemia 06/19/2007  . SICKLE CELL TRAIT 06/19/2007  . PANIC ATTACK 06/19/2007  . MIGRAINE HEADACHE 06/19/2007  . ASTHMA 06/19/2007  . INSOMNIA 06/19/2007  . DEPRESSION, HX OF 06/19/2007    Past Surgical History:  Procedure Laterality Date  . CESAREAN SECTION    . LEEP      Prior to Admission medications   Medication Sig Start Date End Date Taking?  Authorizing Provider  iron polysaccharides (NIFEREX) 150 MG capsule Take 1 capsule (150 mg total) by mouth daily. Patient not taking: Reported on 11/25/2015 06/20/15   Shelly Bombard, MD  lisinopril-hydrochlorothiazide (PRINZIDE,ZESTORETIC) 10-12.5 MG tablet Take 1 tablet by mouth daily. 02/25/15   [provider]  tamoxifen (NOLVADEX) 20 MG tablet Take 1 tablet (20 mg total) by mouth daily. Patient not taking: Reported on 09/20/2019 05/22/19   Nicholas Lose, MD    Allergies Sulfa antibiotics  Family History  Problem Relation Age of Onset  . Hypertension Mother   . Breast cancer Mother 31  . Lung cancer Mother 37       smoker  . Fibroids Mother        +TAH  . Breast cancer Maternal Aunt 70  . Prostate cancer Maternal Uncle 58  . Colon cancer Maternal Grandmother 45  . Breast cancer Maternal Grandmother        dx. 82-85  . Lung cancer Paternal Grandmother        d. 47; smoker  . Cancer Other   . Hypertension Other   . Diabetes Other     Social History Social History   Tobacco Use  . Smoking status: Never Smoker  . Smokeless tobacco: Never Used  Substance Use Topics  . Alcohol use: No    Alcohol/week: 0.0 standard drinks  . Drug use: No    Review of Systems Constitutional: No fever/chills Eyes: Photophobia  but no visual deficits. ENT: No sore throat. Cardiovascular: Denies chest pain. Respiratory: Denies shortness of breath. Gastrointestinal: No abdominal pain.  No vomiting, some nausea earlier but now resolved. Genitourinary: Negative for dysuria. Musculoskeletal: Negative for neck pain.  Negative for back pain. Integumentary: Negative for rash. Neurological: Severe frontal headache.  No numbness nor weakness in her extremities.   ____________________________________________   PHYSICAL EXAM:  VITAL SIGNS: ED Triage Vitals  Enc Vitals Group     BP 05/10/20 0040 (!) 172/98     Pulse Rate 05/10/20 0040 74     Resp 05/10/20 0040 20     Temp 05/10/20  0040 98.5 F (36.9 C)     Temp Source 05/10/20 0040 Oral     SpO2 05/10/20 0040 99 %     Weight 05/10/20 0041 86.2 kg (190 lb)     Height 05/10/20 0041 1.727 m (5\' 8" )     Head Circumference --      Peak Flow --      Pain Score 05/10/20 0041 10     Pain Loc --      Pain Edu? --      Excl. in [place]? --     Constitutional: Alert and oriented.  Eyes: Conjunctivae are normal.  Pupils are equal and reactive bilaterally.  Some photophobia. Head: Atraumatic. Nose: No congestion/rhinnorhea. Mouth/Throat: Patient is wearing a mask. Neck: No stridor.  No meningeal signs.   Cardiovascular: Normal rate, regular rhythm. Good peripheral circulation. Respiratory: Normal respiratory effort.  No retractions. Gastrointestinal: Soft and nontender. No distention.  Musculoskeletal: No lower extremity tenderness nor edema. No gross deformities of extremities. Neurologic:  Normal speech and language. No gross focal neurologic deficits are appreciated.  Skin:  Skin is warm, dry and intact. Psychiatric: Mood and affect are normal. Speech and behavior are normal.  ____________________________________________   LABS (all labs ordered are listed, but only abnormal results are displayed)  Labs Reviewed - No data to display ____________________________________________  EKG  No indication for emergent EKG ____________________________________________  RADIOLOGY I, Hinda Kehr, personally viewed and evaluated these images (plain radiographs) as part of my medical decision making, as well as reviewing the written report by the radiologist.  ED MD interpretation: No indication for emergent imaging  Official radiology report(s): No results found.  ____________________________________________   PROCEDURES   Procedure(s) performed (including Critical Care):  Procedures   ____________________________________________   INITIAL IMPRESSION / MDM / ASSESSMENT AND PLAN / ED COURSE  As part of my  medical decision making, I reviewed the following data within the [facility] notes reviewed and incorporated, Old chart reviewed and Notes from prior ED visits   Differential diagnosis includes, but is not limited to, intracranial hemorrhage, meningitis/encephalitis, previous head trauma, cavernous venous thrombosis, tension headache, temporal arteritis, migraine or migraine equivalent, idiopathic intracranial hypertension, and non-specific headache.  The patient is well-appearing and appears uncomfortable but is not in severe distress.  No focal neurological deficits.  Normal vital signs except for some hypertension.  Discussed and agreed to proceed with migraine treatment.  I ordered the following: Droperidol 2.5 mg IV, Benadryl 12.5 mg IV, Toradol 15 mg IV, Decadron 10 mg IV, normal saline 500 mL normal saline bolus.  Anticipate reassessment and discharge with outpatient follow-up.  Patient has a PCP.  Clinical Course as of 05/10/20 4944  Sat May 10, 2020  0711 The patient reports that she feels better after her migraine cocktail and is ready to be discharged.  I gave my usual and customary return precautions. [CF]    Clinical Course User Index [CF] Hinda Kehr, MD     ____________________________________________  FINAL CLINICAL IMPRESSION(S) / ED DIAGNOSES  Final diagnoses:  Migraine without status migrainosus, not intractable, unspecified migraine type     MEDICATIONS GIVEN DURING THIS VISIT:  Medications  acetaminophen (TYLENOL) tablet 650 mg (650 mg Oral Given 05/10/20 0048)  droperidol (INAPSINE) 2.5 MG/ML injection 2.5 mg (2.5 mg Intravenous Given 05/10/20 0555)  diphenhydrAMINE (BENADRYL) injection 12.5 mg (12.5 mg Intravenous Given 05/10/20 0555)  ketorolac (TORADOL) 30 MG/ML injection 15 mg (15 mg Intravenous Given 05/10/20 0554)  sodium chloride 0.9 % bolus 500 mL (500 mLs Intravenous New Bag/Given 05/10/20 0548)  dexamethasone (DECADRON)  injection 10 mg (10 mg Intravenous Given 05/10/20 0555)     ED Discharge Orders    None      *Please note:  Crystal Edwards was evaluated in Emergency Department on 05/10/2020 for the symptoms described in the history of present illness. She was evaluated in the context of the global COVID-19 pandemic, which necessitated consideration that the patient might be at risk for infection with the SARS-CoV-2 virus that causes COVID-19. Institutional protocols and algorithms that pertain to the evaluation of patients at risk for COVID-19 are in a state of rapid change based on information released by regulatory bodies including the CDC and federal and state organizations. These policies and algorithms were followed during the patient's care in the ED.  Some ED evaluations and interventions may be delayed as a result of limited staffing during and after the pandemic.*  Note:  This document was prepared using Dragon voice recognition software and may include unintentional dictation errors.   Hinda Kehr, MD 05/10/20 [phone]

## 2020-05-10 NOTE — ED Notes (Signed)
[phone] pt waiting in the car

## 2020-05-10 NOTE — Discharge Instructions (Signed)
You have been seen in the Emergency Department (ED) for a migraine.  Please use Tylenol or Motrin as needed for symptoms, but only as written on the box, and take any regular medications that have been prescribed for you. As we have discussed, please follow up with your doctor as soon as possible regarding today's ED visit and your headache symptoms.    Call your doctor or return to the Emergency Department (ED) if you have a worsening headache, sudden and severe headache, confusion, slurred speech, facial droop, weakness or numbness in any arm or leg, extreme fatigue, or other symptoms that concern you.

## 2020-05-10 NOTE — ED Triage Notes (Signed)
Pt reports she woke up with a headache report frontal headache. Photosensitivity, denies any nausea or vomiting. Pt talks in complete sentences no distress noted

## 2020-05-10 NOTE — ED Notes (Signed)
Pt sts hx migraines, sts headache after waking up with a headache; pt denies CP/SOB, pt GCS 15 neuro intact on exam, no focal neuro deficit noted, speaking in full sentences

## 2020-05-10 NOTE — ED Notes (Signed)
10/112.5mg  Lisinopril HCTZ pt took her BP medication reports she forgot to take her medication

## 2022-06-10 ENCOUNTER — Other Ambulatory Visit: Payer: Self-pay

## 2022-06-10 ENCOUNTER — Emergency Department (HOSPITAL_COMMUNITY)
Admission: EM | Admit: 2022-06-10 | Discharge: 2022-06-10 | Disposition: A | Discharge status: Home / Self Care | Payer: Medicaid Other | Attending: Emergency Medicine | Admitting: Emergency Medicine

## 2022-06-10 ENCOUNTER — Emergency Department (HOSPITAL_COMMUNITY): Payer: Medicaid Other

## 2022-06-10 ENCOUNTER — Encounter (HOSPITAL_COMMUNITY): Payer: Self-pay | Admitting: *Deleted

## 2022-06-10 DIAGNOSIS — I10 Essential (primary) hypertension: Secondary | ICD-10-CM | POA: Diagnosis not present

## 2022-06-10 DIAGNOSIS — Z79899 Other long term (current) drug therapy: Secondary | ICD-10-CM | POA: Insufficient documentation

## 2022-06-10 DIAGNOSIS — Z91148 Patient's other noncompliance with medication regimen for other reason: Secondary | ICD-10-CM | POA: Insufficient documentation

## 2022-06-10 DIAGNOSIS — R519 Headache, unspecified: Secondary | ICD-10-CM | POA: Diagnosis present

## 2022-06-10 DIAGNOSIS — J45909 Unspecified asthma, uncomplicated: Secondary | ICD-10-CM | POA: Insufficient documentation

## 2022-06-10 DIAGNOSIS — E876 Hypokalemia: Secondary | ICD-10-CM

## 2022-06-10 LAB — CBC
HCT: 30.3 % — ABNORMAL LOW (ref 36.0–46.0)
Hemoglobin: 9.1 g/dL — ABNORMAL LOW (ref 12.0–15.0)
MCH: 23 pg — ABNORMAL LOW (ref 26.0–34.0)
MCHC: 30 g/dL (ref 30.0–36.0)
MCV: 76.5 fL — ABNORMAL LOW (ref 80.0–100.0)
Platelets: 357 10*3/uL (ref 150–400)
RBC: 3.96 MIL/uL (ref 3.87–5.11)
RDW: 18.4 % — ABNORMAL HIGH (ref 11.5–15.5)
WBC: 6.2 10*3/uL (ref 4.0–10.5)
nRBC: 0 % (ref 0.0–0.2)

## 2022-06-10 LAB — BASIC METABOLIC PANEL
Anion gap: 8 (ref 5–15)
BUN: 8 mg/dL (ref 6–20)
CO2: 26 mmol/L (ref 22–32)
Calcium: 9 mg/dL (ref 8.9–10.3)
Chloride: 104 mmol/L (ref 98–111)
Creatinine, Ser: 0.65 mg/dL (ref 0.44–1.00)
GFR, Estimated: >60 mL/min (ref >60)
Glucose, Bld: 127 mg/dL — ABNORMAL HIGH (ref 70–99)
Potassium: 3.1 mmol/L — ABNORMAL LOW (ref 3.5–5.1)
Sodium: 138 mmol/L (ref 135–145)

## 2022-06-10 MED ORDER — POTASSIUM CHLORIDE CRYS ER 20 MEQ PO TBCR
40.0000 meq | EXTENDED_RELEASE_TABLET | Freq: Once | ORAL | Status: AC
  Administered 2022-06-10: 40 meq via ORAL
  Filled 2022-06-10: qty 2

## 2022-06-10 MED ORDER — LISINOPRIL-HYDROCHLOROTHIAZIDE 10-12.5 MG PO TABS: 1.0000 | ORAL_TABLET | Freq: Every day | ORAL | 3 refills | Status: AC

## 2022-06-10 NOTE — Discharge Instructions (Signed)
Thank you for letting us take care of you today.  Your head CT was normal. We did not find any emergent findings in your blood work. Your potassium level was slightly low. We gave you medication to replace this. Usually this can be managed with dietary changes but some people require daily supplementation. Please follow up with your PCP as soon as possible for recheck of your blood pressure, recheck of your potassium level, and further management of any chronic conditions.  Your blood pressure medication has been prescribed so you may restart this. I recommend taking your blood pressure daily around the same time and keeping a log to present at your next PCP appointment.  For any new or worsening symptoms, please return to nearest ED for re-evaluation.

## 2022-06-10 NOTE — ED Triage Notes (Signed)
Right sided numbness which increases with activity and resolves with rest for about a week.  Pt has been out of her BP medication due not being able to get into her PCP office and her BP has been elevated (out of meds x one month). No focal weakness of facial droop

## 2022-06-10 NOTE — ED Provider Notes (Signed)
EMERGENCY DEPARTMENT AT [facility] Provider Note   CSN: [id number] Arrival date & time: 06/10/22  1330     History  Chief Complaint  Patient presents with   Numbness    Crystal Edwards is a 47 y.o. female with history of iron deficiency anemia, sickle cell trait, migraines, asthma, depression, hypertension who presents to the emergency department complaining of numbness.  Patient states she has been having some right-sided numbness for the past week, made worse with activity, improves with rest.  Reports she has been out of her blood pressure medication for the past month or so, she has been unable to get into her PCP office.  Has been on the same blood pressure medication for the past 9 years.  Mainly has headaches, but also has a history of migraines.  Has been having progressive blurry vision over the past month.  Intermittently has some chest tightness and shortness of breath.  All of the symptoms are made worse when her blood pressure is higher. Has no symptoms at this time.   HPI     Home Medications Prior to Admission medications   Medication Sig Start Date End Date Taking? Authorizing Provider  iron polysaccharides (NIFEREX) 150 MG capsule Take 1 capsule (150 mg total) by mouth daily. Patient not taking: Reported on 11/25/2015 06/20/15   Brock Bad, MD  lisinopril-hydrochlorothiazide (ZESTORETIC) 10-12.5 MG tablet Take 1 tablet by mouth daily. 06/10/22   Aniesha Haughn T, PA-C  tamoxifen (NOLVADEX) 20 MG tablet Take 1 tablet (20 mg total) by mouth daily. Patient not taking: Reported on 09/20/2019 05/22/19   Serena Croissant, MD      Allergies    Sulfa antibiotics    Review of Systems   Review of Systems  Eyes:  Positive for visual disturbance.  Respiratory:  Positive for chest tightness and shortness of breath.   Neurological:  Positive for numbness and headaches.  All other systems reviewed and are negative.   Physical Exam Updated Vital  Signs BP (!) 177/99   Pulse 70   Temp 97.8 F (36.6 C) (Oral)   Resp 18   Ht 5\' 8"  (1.727 m)   Wt 95.3 kg   SpO2 100%   BMI 31.93 kg/m  Physical Exam Vitals and nursing note reviewed.  Constitutional:      Appearance: Normal appearance.  HENT:     Head: Normocephalic and atraumatic.  Eyes:     Conjunctiva/sclera: Conjunctivae normal.  Cardiovascular:     Rate and Rhythm: Normal rate and regular rhythm.  Pulmonary:     Effort: Pulmonary effort is normal. No respiratory distress.     Breath sounds: Normal breath sounds.  Abdominal:     General: There is no distension.     Palpations: Abdomen is soft.     Tenderness: There is no abdominal tenderness.  Skin:    General: Skin is warm and dry.  Neurological:     General: No focal deficit present.     Mental Status: She is alert.     Comments: Neuro: Speech is clear, able to follow commands. CN III-XII intact grossly intact. PERRLA. EOMI. Sensation intact throughout. Str 5/5 all extremities.     ED Results / Procedures / Treatments   Labs (all labs ordered are listed, but only abnormal results are displayed) Labs Reviewed  CBC  BASIC METABOLIC PANEL    EKG EKG Interpretation  Date/Time:  Thursday Jun 10 2022 13:51:56 EDT Ventricular Rate:  72 PR  Interval:  168 QRS Duration: 88 QT Interval:  429 QTC Calculation: 470 R Axis:   66 Text Interpretation: Sinus rhythm Probable left atrial enlargement Confirmed by Vivien Rossetti ([id number]) on 06/10/2022 2:33:32 PM  Radiology No results found.  Procedures Procedures    Medications Ordered in ED Medications - No data to display  ED Course/ Medical Decision Making/ A&P                             Medical Decision Making Amount and/or Complexity of Data Reviewed Labs: ordered.   This patient is a 47 y.o. female  who presents to the ED for concern of numbness.   Differential diagnoses prior to evaluation: The emergent differential diagnosis includes, but is  not limited to,  CVA, neuropathy, hypertensive urgency vs emergency. This is not an exhaustive differential.   Past Medical History / Co-morbidities / Social History: iron deficiency anemia, sickle cell trait, migraines, asthma, depression, hypertension   Physical Exam: Physical exam performed. The pertinent findings include: Per tensive to 177/99, otherwise normal vital signs.  No acute distress.  Normal neurologic exam as above with no focal deficits.  Lab Tests/Imaging studies: I personally ordered labs/imaging to include: CBC, BMP, CT head  Cardiac monitoring: EKG obtained and interpreted by my attending physician which shows: sinus rhythm, no ischemic changes   Disposition: Patient discussed and care transferred to [facility] PA-C at shift change. Please see his/her note for further details regarding further ED course and disposition. Plan at time of handoff is follow up on labs and imaging. Low concern for CVA as cause of patient's symptoms as she has no lasting deficits and symptoms seem correlated with elevated blood pressure readings. Anticipate if workup unremarkable, d/c to home with HTN refills and encourage PCP follow up. Pt plans to establish with new PCP.    Final Clinical Impression(s) / ED Diagnoses Final diagnoses:  Patient's other noncompliance with medication regimen for other reason  Hypertension, unspecified type    Rx / DC Orders ED Discharge Orders          Ordered    lisinopril-hydrochlorothiazide (ZESTORETIC) 10-12.5 MG tablet  Daily        06/10/22 1459           Portions of this report may have been transcribed using voice recognition software. Every effort was made to ensure accuracy; however, inadvertent computerized transcription errors may be present.    Jeanella Flattery 06/10/22 1459    Mardene Sayer, MD 06/10/22 [phone]

## 2022-06-10 NOTE — ED Provider Notes (Signed)
Accepted handoff at shift change from [employer], PA-C. Please see prior provider note for more detail.   Briefly: Patient is 47 y.o. female with PM HTN presenting to ED for evaluation of chest tightness, right arm numbness, and dyspnea ongoing for about 1 month now. Symptoms are worse with exertion and better at rest. She also reports gradually decreasing vision bilaterally. She is asymptomatic now. EKG good, vital signs stable. If blood work, CT normal, anti-hypertensives have been prescribed for 3 months and pt stable for discharge home.   Labs reveal a mild hypokalemia which was repleted.  History of similar.  Also shows a hemoglobin 11.1 consistent with baseline.  Patient remains asymptomatic.  CT head negative.  Patient stable for discharge home.  Medication for antihypertensives previously prescribed by initial provider.  Instructed patient to restart these and follow-up closely with primary care.  Patient expressed understanding with plan.  Strict ED return precautions given, all questions answered, and stable for discharge.   Richardson Dopp 06/10/22 1552    Vanetta Mulders, MD 06/12/22 2330
